# Patient Record
Sex: Female | Born: 1960 | Race: White | Hispanic: No | Marital: Married | Smoking: Never smoker
Health system: Southern US, Community
[De-identification: ages and names within clinical notes are randomized; demographics above are authoritative.]

## PROBLEM LIST (undated history)

## (undated) DIAGNOSIS — Z973 Presence of spectacles and contact lenses: Secondary | ICD-10-CM

## (undated) DIAGNOSIS — E785 Hyperlipidemia, unspecified: Secondary | ICD-10-CM

## (undated) DIAGNOSIS — K219 Gastro-esophageal reflux disease without esophagitis: Secondary | ICD-10-CM

## (undated) DIAGNOSIS — F988 Other specified behavioral and emotional disorders with onset usually occurring in childhood and adolescence: Secondary | ICD-10-CM

## (undated) HISTORY — PX: BREAST SURGERY: SHX581

## (undated) HISTORY — DX: Hyperlipidemia, unspecified: E78.5

## (undated) HISTORY — PX: BREAST EXCISIONAL BIOPSY: SUR124

---

## 2000-10-15 ENCOUNTER — Encounter: Payer: Self-pay | Admitting: *Deleted

## 2000-10-15 ENCOUNTER — Encounter: Admission: RE | Admit: 2000-10-15 | Discharge: 2000-10-15 | Payer: Self-pay | Admitting: *Deleted

## 2001-03-11 ENCOUNTER — Other Ambulatory Visit: Admission: RE | Admit: 2001-03-11 | Discharge: 2001-03-11 | Payer: Self-pay | Admitting: *Deleted

## 2002-09-20 ENCOUNTER — Encounter: Admission: RE | Admit: 2002-09-20 | Discharge: 2002-09-20 | Payer: Self-pay | Admitting: *Deleted

## 2002-09-20 ENCOUNTER — Encounter: Payer: Self-pay | Admitting: *Deleted

## 2002-10-06 ENCOUNTER — Other Ambulatory Visit: Admission: RE | Admit: 2002-10-06 | Discharge: 2002-10-06 | Payer: Self-pay | Admitting: *Deleted

## 2003-07-07 HISTORY — PX: TOTAL ABDOMINAL HYSTERECTOMY: SHX209

## 2003-10-25 ENCOUNTER — Other Ambulatory Visit: Admission: RE | Admit: 2003-10-25 | Discharge: 2003-10-25 | Payer: Self-pay | Admitting: *Deleted

## 2003-11-13 ENCOUNTER — Inpatient Hospital Stay (HOSPITAL_COMMUNITY): Admission: RE | Admit: 2003-11-13 | Discharge: 2003-11-17 | Payer: Self-pay | Admitting: *Deleted

## 2003-11-18 ENCOUNTER — Inpatient Hospital Stay (HOSPITAL_COMMUNITY): Admission: RE | Admit: 2003-11-18 | Discharge: 2003-11-20 | Payer: Self-pay | Admitting: Obstetrics and Gynecology

## 2003-11-18 ENCOUNTER — Encounter: Payer: Self-pay | Admitting: Obstetrics and Gynecology

## 2004-07-06 HISTORY — PX: ABDOMINAL HYSTERECTOMY: SHX81

## 2005-10-19 ENCOUNTER — Other Ambulatory Visit: Admission: RE | Admit: 2005-10-19 | Discharge: 2005-10-19 | Payer: Self-pay | Admitting: Obstetrics and Gynecology

## 2010-07-27 ENCOUNTER — Encounter: Payer: Self-pay | Admitting: *Deleted

## 2012-10-26 ENCOUNTER — Telehealth: Payer: Self-pay | Admitting: Physician Assistant

## 2012-10-26 MED ORDER — AMPHETAMINE-DEXTROAMPHETAMINE 30 MG PO TABS: 30.0000 mg | ORAL_TABLET | Freq: Every day | ORAL | Status: DC

## 2012-10-26 NOTE — Telephone Encounter (Signed)
Pt appropriate for refill.  Next office visit due June.  Rx printed for provider signature.  Pt called and told ready for pick up anytime tomorrow.

## 2012-12-01 ENCOUNTER — Telehealth: Payer: Self-pay | Admitting: Physician Assistant

## 2012-12-01 MED ORDER — AMPHETAMINE-DEXTROAMPHETAMINE 30 MG PO TABS: 30.0000 mg | ORAL_TABLET | Freq: Every day | ORAL | Status: DC

## 2012-12-01 NOTE — Telephone Encounter (Signed)
Refill appropriate.  Will need appt for next refill.  Rx printed for provider signature

## 2012-12-01 NOTE — Telephone Encounter (Signed)
Pt called left mess Rx ready for pick up.  Also informed that will need appt for next refill

## 2012-12-02 ENCOUNTER — Ambulatory Visit (INDEPENDENT_AMBULATORY_CARE_PROVIDER_SITE_OTHER): Payer: 59 | Admitting: *Deleted

## 2012-12-02 DIAGNOSIS — N390 Urinary tract infection, site not specified: Secondary | ICD-10-CM

## 2012-12-03 LAB — URINALYSIS W MICROSCOPIC + REFLEX CULTURE
Crystals: NONE SEEN
Nitrite: POSITIVE — AB

## 2012-12-05 LAB — URINE CULTURE: Colony Count: >=100000

## 2013-01-05 ENCOUNTER — Ambulatory Visit (INDEPENDENT_AMBULATORY_CARE_PROVIDER_SITE_OTHER): Payer: 59 | Admitting: Physician Assistant

## 2013-01-05 ENCOUNTER — Encounter: Payer: Self-pay | Admitting: Physician Assistant

## 2013-01-05 VITALS — BP 110/80 | HR 96 | Temp 98.9°F | Resp 16 | Ht 69.0 in | Wt 181.0 lb

## 2013-01-05 DIAGNOSIS — F988 Other specified behavioral and emotional disorders with onset usually occurring in childhood and adolescence: Secondary | ICD-10-CM

## 2013-01-05 MED ORDER — AMPHETAMINE-DEXTROAMPHETAMINE 30 MG PO TABS: 30.0000 mg | ORAL_TABLET | Freq: Two times a day (BID) | ORAL | Status: DC

## 2013-01-05 NOTE — Progress Notes (Signed)
   Patient ID: Kathryn Murray MRN: [id number], DOB: Sep 11, 1960, 52 y.o. Date of Encounter: 01/05/2013, 10:51 AM    Chief Complaint:  Chief Complaint  Patient presents with  . Medication Refill     HPI: 52 y.o. year old white female here for f/u of ADD. She is currently taking Adderall 30mg  Qam. This works perfectly during the day, but in the afternoon, when it wears off, she "crashes." She has absolutely no energy. She cannot get anything done the rest of the day. In past Adderall XR caused insomnia.  No other complaints. No palpitations, headache, abd pain.  She works from [employer]. She can tell a big difference when she takes hte medication and it really helps her focus, pay attention, and complete tasks. Works great except for the "crash" when it wears off.    Home Meds: See attached medication section for any medications that were entered at today's visit. The computer does not put those onto this list.The following list is a list of meds entered prior to today's visit.   No current outpatient prescriptions on file prior to visit.   No current facility-administered medications on file prior to visit.    Allergies:  Allergies  Allergen Reactions  . Sulphadimidine (Sulfamethazine)       Review of Systems: See HPI for pertinent ROS. All other ROS negative.    Physical Exam: Blood pressure 110/80, pulse 96, temperature 98.9 F (37.2 C), temperature source Oral, resp. rate 16, height 5\' 9"  (1.753 m), weight 181 lb (82.101 kg)., Body mass index is 26.72 kg/(m^2). General: WNWD WF. Appears in no acute distress. Lungs: Clear bilaterally to auscultation without wheezes, rales, or rhonchi. Breathing is unlabored. Heart: Regular rhythm. No murmurs, rubs, or gallops. Msk:  Strength and tone normal for age. Extremities/Skin: Warm and dry.  Neuro: Alert and oriented X 3. Moves all extremities spontaneously. Gait is normal. CNII-XII grossly in tact. Psych:  Responds to  questions appropriately with a normal affect.     ASSESSMENT AND PLAN:  52 y.o. year old female with  1. ADD (attention deficit disorder) Will try Adderall 30mg  BID. She wants to change from QD b/c of "crash." But, cannot use XR b/c it caused insomnia. This should work well. She takes am dose around 7:30am. She is to take 2nd dose around 12:30pm. Adjust timing depending on insomnia, etc. I am only giving her one month supply untils we see how this works for her. F/U OV 1 month. - amphetamine-dextroamphetamine (ADDERALL) 30 MG tablet; Take 1 tablet (30 mg total) by mouth 2 (two) times daily.  Dispense: 60 tablet; Refill: 0   Signed, [address] [place], [place], [facility] 01/05/2013 10:51 AM

## 2013-02-02 ENCOUNTER — Ambulatory Visit: Payer: 59 | Admitting: Physician Assistant

## 2013-02-28 ENCOUNTER — Telehealth: Payer: Self-pay | Admitting: Physician Assistant

## 2013-02-28 DIAGNOSIS — F988 Other specified behavioral and emotional disorders with onset usually occurring in childhood and adolescence: Secondary | ICD-10-CM

## 2013-02-28 MED ORDER — AMPHETAMINE-DEXTROAMPHETAMINE 30 MG PO TABS: 30.0000 mg | ORAL_TABLET | Freq: Two times a day (BID) | ORAL | Status: DC

## 2013-02-28 NOTE — Telephone Encounter (Signed)
Adderall 30 mg 1 BID #60 - she said that it works great

## 2013-02-28 NOTE — Telephone Encounter (Signed)
Last OV 01/05/13  New rx #60 that day.  Refill appropriate  Rx printed for provider signature

## 2013-04-19 ENCOUNTER — Telehealth: Payer: Self-pay | Admitting: Physician Assistant

## 2013-04-19 DIAGNOSIS — F988 Other specified behavioral and emotional disorders with onset usually occurring in childhood and adolescence: Secondary | ICD-10-CM

## 2013-04-19 MED ORDER — AMPHETAMINE-DEXTROAMPHETAMINE 30 MG PO TABS: 30.0000 mg | ORAL_TABLET | Freq: Two times a day (BID) | ORAL | Status: DC

## 2013-04-19 NOTE — Telephone Encounter (Signed)
Ok to refill 

## 2013-04-19 NOTE — Telephone Encounter (Signed)
Is she is still taking the Adderall 30 mg twice a day? If so, okay to refill. #60 with 0 refill.

## 2013-04-19 NOTE — Telephone Encounter (Signed)
Needs her adderall refilled

## 2013-04-19 NOTE — Telephone Encounter (Addendum)
rx printed for provider signature.  Pt aware ready for pick up 

## 2013-06-08 ENCOUNTER — Telehealth: Payer: Self-pay | Admitting: Physician Assistant

## 2013-06-08 DIAGNOSIS — F988 Other specified behavioral and emotional disorders with onset usually occurring in childhood and adolescence: Secondary | ICD-10-CM

## 2013-06-08 NOTE — Telephone Encounter (Signed)
Pt is calling because she is needing a refill on her adderral Call back number is [phone]

## 2013-06-08 NOTE — Telephone Encounter (Signed)
Last refill 10/15  #60.  Last OV 01/05/13. OK refill?

## 2013-06-09 MED ORDER — AMPHETAMINE-DEXTROAMPHETAMINE 30 MG PO TABS: 30.0000 mg | ORAL_TABLET | Freq: Two times a day (BID) | ORAL | Status: DC

## 2013-06-09 NOTE — Telephone Encounter (Signed)
Approved. Go Ahead and give her 3 printed prescriptions. One to fill now, one to fill 07/10/2013,   one to fill 08/10/2013

## 2013-06-09 NOTE — Telephone Encounter (Signed)
RX's printed for provider signature

## 2013-10-12 ENCOUNTER — Encounter: Payer: Self-pay | Admitting: Physician Assistant

## 2013-10-12 ENCOUNTER — Ambulatory Visit (INDEPENDENT_AMBULATORY_CARE_PROVIDER_SITE_OTHER): Payer: 59 | Admitting: Physician Assistant

## 2013-10-12 VITALS — BP 118/74 | HR 88 | Temp 98.7°F | Resp 18 | Ht 67.5 in | Wt 173.0 lb

## 2013-10-12 DIAGNOSIS — B9689 Other specified bacterial agents as the cause of diseases classified elsewhere: Secondary | ICD-10-CM

## 2013-10-12 DIAGNOSIS — J988 Other specified respiratory disorders: Secondary | ICD-10-CM

## 2013-10-12 DIAGNOSIS — A499 Bacterial infection, unspecified: Secondary | ICD-10-CM

## 2013-10-12 MED ORDER — AZITHROMYCIN 250 MG PO TABS: ORAL_TABLET | ORAL | Status: DC

## 2013-10-12 NOTE — Progress Notes (Signed)
    Patient ID: Kathryn Murray MRN: [id number], DOB: [birth year], 53 y.o. Date of Encounter: 10/12/2013, 2:49 PM    Chief Complaint:  Chief Complaint  Patient presents with  . fever/cough x 2 days    congestion     HPI: 53 y.o. year old female reports that she developed a fever 2 days ago. Reports that yesterday fever was 101. Says she felt really bad and felt very sick yesterday. Continued to have fever throughout last  night. Taken ibuprofen today so she now is afebrile. Says that for the past few days she has also had chest congestion and can feel phlegm in her chest but cannot get it out. Has had no head or nasal congestion. No nasal mucous, Sore throat or earache.     Home Meds: See attached medication section for any medications that were entered at today's visit. The computer does not put those onto this list.The following list is a list of meds entered prior to today's visit.   Current Outpatient Prescriptions on File Prior to Visit  Medication Sig Dispense Refill  . amphetamine-dextroamphetamine (ADDERALL) 30 MG tablet Take 1 tablet (30 mg total) by mouth 2 (two) times daily.  60 tablet  0   No current facility-administered medications on file prior to visit.    Allergies:  Allergies  Allergen Reactions  . Sulphadimidine [Sulfamethazine]       Review of Systems: See HPI for pertinent ROS. All other ROS negative.    Physical Exam: Blood pressure 118/74, pulse 88, temperature 98.7 F (37.1 C), temperature source Oral, resp. rate 18, height 5' 7.5" (1.715 m), weight 173 lb (78.472 kg)., Body mass index is 26.68 kg/(m^2). General: WNWD WF.  Appears in no acute distress. HEENT: Normocephalic, atraumatic, eyes without discharge, sclera non-icteric, nares are without discharge. Bilateral auditory canals clear, TM's are without perforation, pearly grey and translucent with reflective cone of light bilaterally. Oral cavity moist, posterior pharynx without exudate, erythema,  peritonsillar abscess. No Tenderness with percussion of frontal and maxillary sinuses.  Neck: Supple. No thyromegaly. No lymphadenopathy. Lungs: Clear bilaterally to auscultation without wheezes, rales, or rhonchi. Breathing is unlabored. Heart: Regular rhythm. No murmurs, rubs, or gallops. Msk:  Strength and tone normal for age. Extremities/Skin: Warm and dry. Neuro: Alert and oriented X 3. Moves all extremities spontaneously. Gait is normal. CNII-XII grossly in tact. Psych:  Responds to questions appropriately with a normal affect.     ASSESSMENT AND PLAN:  53 y.o. year old female with  1. Bacterial lower respiratory infection - azithromycin (ZITHROMAX) 250 MG tablet; Day 1: Take 2 daily.  Days 2-5: Take 1 daily.  Dispense: 6 tablet; Refill: 0 And also use Mucinex DM as expectorant. Followup if symptoms worsen significantly or if symptoms not resolved within one week after completion of antibiotic.  Marin Olp [place], [place], [facility] 10/12/2013 2:49 PM

## 2013-12-25 ENCOUNTER — Encounter: Payer: Self-pay | Admitting: Physician Assistant

## 2013-12-25 ENCOUNTER — Ambulatory Visit (INDEPENDENT_AMBULATORY_CARE_PROVIDER_SITE_OTHER): Payer: 59 | Admitting: Physician Assistant

## 2013-12-25 VITALS — BP 126/82 | HR 72 | Temp 98.5°F | Resp 18 | Wt 168.0 lb

## 2013-12-25 DIAGNOSIS — F988 Other specified behavioral and emotional disorders with onset usually occurring in childhood and adolescence: Secondary | ICD-10-CM | POA: Insufficient documentation

## 2013-12-25 MED ORDER — AMPHETAMINE-DEXTROAMPHETAMINE 30 MG PO TABS: 30.0000 mg | ORAL_TABLET | Freq: Two times a day (BID) | ORAL | Status: DC

## 2013-12-25 NOTE — Progress Notes (Signed)
Patient ID: LAQUANNA VEAZEY MRN: [id number], DOB: 10/24/1960, 53 y.o. Date of Encounter: 12/25/2013, 3:04 PM    Chief Complaint:  Chief Complaint  Patient presents with  . checkup for Adderall refill     HPI: 53 y.o. year old white female here for f/u of ADD.  Her last routine office visit for this was actually back in July 2014.  She says the reason that it has been a while since she has had followup for this is that she had a hand injury. Said that she simply did a flicking motion with her fingers-- but that caused problems with her tendons. She is seeing Dr. Burney Gauze at the [facility]. Says that he has been treating it with a splint for 6 weeks and then followup. Says that her followup appointment is tomorrow to decide what to do next. Says it has not healed. She has been out of work for 6 months. 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 Works on [employer]. Says that she is paid based on productivity. Says that she has to be able to work quickly when she returns to work. She wanted to go ahead and come here and get refills on her Adderall 7 if she does return to work after tomorrow's visit, she will have the medication available to use.  At her last office visit regarding this and 01/2013 she was taking Adderall 30mg  Qam. She said that worked perfectly during the day, but in the afternoon, when it would wear off, she would "crash." She had absolutely no energy. She could not get anything done the rest of the day. Inthe past, Adderall XR caused insomnia.  At that OV, I prescribed Adderall 30mg  BID. Says that that does seem to work perfectly. She was able to adjust the timing of when she took each dose. She was able to keep good coverage regarding having good attention and focus through the entire day, but then not have insomnia at night.     Home Meds:    Outpatient Prescriptions Prior to Visit  Medication Sig  Dispense Refill  . oxyCODONE-acetaminophen (PERCOCET/ROXICET) 5-325 MG per tablet Take 1 tablet by mouth every 6 (six) hours as needed for severe pain.      Marland Kitchen amphetamine-dextroamphetamine (ADDERALL) 30 MG tablet Take 1 tablet (30 mg total) by mouth 2 (two) times daily.  60 tablet  0  . azithromycin (ZITHROMAX) 250 MG tablet Day 1: Take 2 daily.  Days 2-5: Take 1 daily.  6 tablet  0   No facility-administered medications prior to visit.     Allergies:  Allergies  Allergen Reactions  . Sulphadimidine [Sulfamethazine]       Review of Systems: See HPI for pertinent ROS. All other ROS negative.    Physical Exam: Blood pressure 126/82, pulse 72, temperature 98.5 F (36.9 C), temperature source Oral, resp. rate 18, weight 168 lb (76.204 kg)., Body mass index is 25.91 kg/(m^2). General: WNWD WF. Appears in no acute distress. Lungs: Clear bilaterally to auscultation without wheezes, rales, or rhonchi. Breathing is unlabored. Heart: Regular rhythm. No murmurs, rubs, or gallops. Msk:  Strength and tone normal for age. Extremities/Skin: Warm and dry.  Neuro: Alert and oriented X 3. Moves all extremities spontaneously. Gait is normal. CNII-XII grossly in tact. Psych:  Responds to questions appropriately with a normal affect.     ASSESSMENT AND PLAN:  53 y.o. year old female with  1. ADD (  attention deficit disorder) I Printed 3 prescriptions. One could be filled now, 1 for 01/24/14, one for 02/24/14. She states that her pharmacy will keep future prescriptions on hold for her. Regular office visit in 6 months or sooner if needed. - amphetamine-dextroamphetamine (ADDERALL) 30 MG tablet; Take 1 tablet (30 mg total) by mouth 2 (two) times daily.  Dispense: 60 tablet; Refill: 0 - amphetamine-dextroamphetamine (ADDERALL) 30 MG tablet; Take 1 tablet (30 mg total) by mouth 2 (two) times daily.  Dispense: 60 tablet; Refill: 0 - amphetamine-dextroamphetamine (ADDERALL) 30 MG tablet; Take 1 tablet (30  mg total) by mouth 2 (two) times daily.  Dispense: 60 tablet; Refill: 0  Signed, [address], [place], [facility] 12/25/2013 3:04 PM

## 2013-12-28 ENCOUNTER — Encounter (HOSPITAL_BASED_OUTPATIENT_CLINIC_OR_DEPARTMENT_OTHER): Payer: Self-pay | Admitting: *Deleted

## 2013-12-28 ENCOUNTER — Other Ambulatory Visit: Payer: Self-pay | Admitting: Orthopedic Surgery

## 2013-12-28 NOTE — Progress Notes (Signed)
No labs needed

## 2014-01-03 ENCOUNTER — Encounter (HOSPITAL_BASED_OUTPATIENT_CLINIC_OR_DEPARTMENT_OTHER)
Admission: RE | Disposition: A | Discharge status: Home / Self Care | Payer: Self-pay | Source: Ambulatory Visit | Attending: Orthopedic Surgery

## 2014-01-03 ENCOUNTER — Encounter (HOSPITAL_BASED_OUTPATIENT_CLINIC_OR_DEPARTMENT_OTHER): Payer: Self-pay | Admitting: Certified Registered"

## 2014-01-03 ENCOUNTER — Ambulatory Visit (HOSPITAL_BASED_OUTPATIENT_CLINIC_OR_DEPARTMENT_OTHER)
Admission: RE | Admit: 2014-01-03 | Discharge: 2014-01-03 | Disposition: A | Discharge status: Home / Self Care | Payer: 59 | Source: Ambulatory Visit | Attending: Orthopedic Surgery | Admitting: Orthopedic Surgery

## 2014-01-03 ENCOUNTER — Encounter (HOSPITAL_BASED_OUTPATIENT_CLINIC_OR_DEPARTMENT_OTHER): Payer: 59 | Admitting: Certified Registered"

## 2014-01-03 ENCOUNTER — Ambulatory Visit (HOSPITAL_BASED_OUTPATIENT_CLINIC_OR_DEPARTMENT_OTHER): Payer: 59 | Admitting: Certified Registered"

## 2014-01-03 DIAGNOSIS — M66239 Spontaneous rupture of extensor tendons, unspecified forearm: Secondary | ICD-10-CM | POA: Insufficient documentation

## 2014-01-03 DIAGNOSIS — M66241 Spontaneous rupture of extensor tendons, right hand: Secondary | ICD-10-CM

## 2014-01-03 DIAGNOSIS — K219 Gastro-esophageal reflux disease without esophagitis: Secondary | ICD-10-CM | POA: Insufficient documentation

## 2014-01-03 DIAGNOSIS — M66231 Spontaneous rupture of extensor tendons, right forearm: Secondary | ICD-10-CM

## 2014-01-03 DIAGNOSIS — M66249 Spontaneous rupture of extensor tendons, unspecified hand: Principal | ICD-10-CM | POA: Insufficient documentation

## 2014-01-03 DIAGNOSIS — F988 Other specified behavioral and emotional disorders with onset usually occurring in childhood and adolescence: Secondary | ICD-10-CM | POA: Insufficient documentation

## 2014-01-03 HISTORY — PX: LIGAMENT REPAIR: SHX5444

## 2014-01-03 HISTORY — DX: Gastro-esophageal reflux disease without esophagitis: K21.9

## 2014-01-03 HISTORY — DX: Other specified behavioral and emotional disorders with onset usually occurring in childhood and adolescence: F98.8

## 2014-01-03 HISTORY — DX: Presence of spectacles and contact lenses: Z97.3

## 2014-01-03 LAB — POCT HEMOGLOBIN-HEMACUE: Hemoglobin: 14.1 g/dL (ref 12.0–15.0)

## 2014-01-03 SURGERY — REPAIR, LIGAMENT
Anesthesia: General | Laterality: Right

## 2014-01-03 MED ORDER — ONDANSETRON HCL 4 MG/2ML IJ SOLN
INTRAMUSCULAR | Status: DC | PRN
  Administered 2014-01-03: 4 mg via INTRAVENOUS

## 2014-01-03 MED ORDER — BUPIVACAINE HCL (PF) 0.25 % IJ SOLN
INTRAMUSCULAR | Status: AC
  Filled 2014-01-03: qty 30

## 2014-01-03 MED ORDER — LIDOCAINE HCL (CARDIAC) 20 MG/ML IV SOLN
INTRAVENOUS | Status: DC | PRN
  Administered 2014-01-03: 60 mg via INTRAVENOUS

## 2014-01-03 MED ORDER — HYDROMORPHONE HCL PF 1 MG/ML IJ SOLN
0.2500 mg | INTRAMUSCULAR | Status: DC | PRN
Start: 2014-01-03 — End: 2014-01-03
  Administered 2014-01-03 (×4): 0.5 mg via INTRAVENOUS

## 2014-01-03 MED ORDER — FENTANYL CITRATE 0.05 MG/ML IJ SOLN: 50.0000 ug | INTRAMUSCULAR | Status: DC | PRN

## 2014-01-03 MED ORDER — LACTATED RINGERS IV SOLN
INTRAVENOUS | Status: DC
  Administered 2014-01-03 (×2): via INTRAVENOUS

## 2014-01-03 MED ORDER — OXYCODONE HCL 5 MG/5ML PO SOLN: 5.0000 mg | Freq: Once | ORAL | Status: AC | PRN

## 2014-01-03 MED ORDER — OXYCODONE-ACETAMINOPHEN 5-325 MG PO TABS: 1.0000 | ORAL_TABLET | ORAL | Status: DC | PRN

## 2014-01-03 MED ORDER — PROPOFOL 10 MG/ML IV EMUL
INTRAVENOUS | Status: AC
Start: 2014-01-03 — End: 2014-01-03
  Filled 2014-01-03: qty 50

## 2014-01-03 MED ORDER — DEXAMETHASONE SODIUM PHOSPHATE 10 MG/ML IJ SOLN
INTRAMUSCULAR | Status: DC | PRN
  Administered 2014-01-03: 10 mg via INTRAVENOUS

## 2014-01-03 MED ORDER — HYDROMORPHONE HCL PF 1 MG/ML IJ SOLN
INTRAMUSCULAR | Status: AC
  Filled 2014-01-03: qty 1

## 2014-01-03 MED ORDER — PROMETHAZINE HCL 25 MG/ML IJ SOLN: 6.2500 mg | INTRAMUSCULAR | Status: DC | PRN

## 2014-01-03 MED ORDER — FENTANYL CITRATE 0.05 MG/ML IJ SOLN
INTRAMUSCULAR | Status: DC | PRN
  Administered 2014-01-03 (×2): 25 ug via INTRAVENOUS
  Administered 2014-01-03: 50 ug via INTRAVENOUS

## 2014-01-03 MED ORDER — MIDAZOLAM HCL 5 MG/5ML IJ SOLN
INTRAMUSCULAR | Status: DC | PRN
  Administered 2014-01-03: 2 mg via INTRAVENOUS

## 2014-01-03 MED ORDER — PROPOFOL 10 MG/ML IV BOLUS
INTRAVENOUS | Status: DC | PRN
  Administered 2014-01-03: 200 mg via INTRAVENOUS

## 2014-01-03 MED ORDER — CEFAZOLIN SODIUM-DEXTROSE 2-3 GM-% IV SOLR
2.0000 g | INTRAVENOUS | Status: AC
  Administered 2014-01-03: 2 g via INTRAVENOUS

## 2014-01-03 MED ORDER — OXYCODONE HCL 5 MG PO TABS
5.0000 mg | ORAL_TABLET | Freq: Once | ORAL | Status: AC | PRN
  Administered 2014-01-03: 5 mg via ORAL

## 2014-01-03 MED ORDER — BUPIVACAINE HCL (PF) 0.25 % IJ SOLN
INTRAMUSCULAR | Status: DC | PRN
  Administered 2014-01-03: 6 mL

## 2014-01-03 MED ORDER — MIDAZOLAM HCL 2 MG/2ML IJ SOLN
INTRAMUSCULAR | Status: AC
  Filled 2014-01-03: qty 2

## 2014-01-03 MED ORDER — MIDAZOLAM HCL 2 MG/2ML IJ SOLN: 1.0000 mg | INTRAMUSCULAR | Status: DC | PRN

## 2014-01-03 MED ORDER — CHLORHEXIDINE GLUCONATE 4 % EX LIQD: 60.0000 mL | Freq: Once | CUTANEOUS | Status: DC

## 2014-01-03 MED ORDER — CEFAZOLIN SODIUM-DEXTROSE 2-3 GM-% IV SOLR
INTRAVENOUS | Status: AC
  Filled 2014-01-03: qty 50

## 2014-01-03 MED ORDER — OXYCODONE HCL 5 MG PO TABS
ORAL_TABLET | ORAL | Status: AC
  Filled 2014-01-03: qty 1

## 2014-01-03 MED ORDER — FENTANYL CITRATE 0.05 MG/ML IJ SOLN
INTRAMUSCULAR | Status: AC
  Filled 2014-01-03: qty 6

## 2014-01-03 SURGICAL SUPPLY — 68 items
APL SKNCLS STERI-STRIP NONHPOA (GAUZE/BANDAGES/DRESSINGS)
BAG DECANTER FOR FLEXI CONT (MISCELLANEOUS) IMPLANT
BANDAGE ELASTIC 3 VELCRO ST LF (GAUZE/BANDAGES/DRESSINGS) IMPLANT
BANDAGE ELASTIC 4 VELCRO ST LF (GAUZE/BANDAGES/DRESSINGS) ×3 IMPLANT
BENZOIN TINCTURE PRP APPL 2/3 (GAUZE/BANDAGES/DRESSINGS) IMPLANT
BLADE MINI RND TIP GREEN BEAV (BLADE) IMPLANT
BLADE SURG 15 STRL LF DISP TIS (BLADE) ×1 IMPLANT
BLADE SURG 15 STRL SS (BLADE) ×3
BNDG CMPR 9X4 STRL LF SNTH (GAUZE/BANDAGES/DRESSINGS) ×1
BNDG CMPR MD 5X2 ELC HKLP STRL (GAUZE/BANDAGES/DRESSINGS)
BNDG COHESIVE 3X5 TAN STRL LF (GAUZE/BANDAGES/DRESSINGS) ×1 IMPLANT
BNDG ELASTIC 2 VLCR STRL LF (GAUZE/BANDAGES/DRESSINGS) IMPLANT
BNDG ESMARK 4X9 LF (GAUZE/BANDAGES/DRESSINGS) ×2 IMPLANT
BNDG GAUZE ELAST 4 BULKY (GAUZE/BANDAGES/DRESSINGS) ×3 IMPLANT
CANISTER SUCT 1200ML W/VALVE (MISCELLANEOUS) IMPLANT
CLOSURE WOUND 1/2 X4 (GAUZE/BANDAGES/DRESSINGS)
CORDS BIPOLAR (ELECTRODE) ×3 IMPLANT
COVER TABLE BACK 60X90 (DRAPES) ×3 IMPLANT
CUFF TOURNIQUET SINGLE 18IN (TOURNIQUET CUFF) ×2 IMPLANT
DECANTER SPIKE VIAL GLASS SM (MISCELLANEOUS) IMPLANT
DRAPE EXTREMITY T 121X128X90 (DRAPE) ×3 IMPLANT
DRAPE SURG 17X23 STRL (DRAPES) ×3 IMPLANT
DURAPREP 26ML APPLICATOR (WOUND CARE) ×3 IMPLANT
GAUZE SPONGE 4X4 12PLY STRL (GAUZE/BANDAGES/DRESSINGS) ×3 IMPLANT
GAUZE SPONGE 4X4 16PLY XRAY LF (GAUZE/BANDAGES/DRESSINGS) ×2 IMPLANT
GAUZE XEROFORM 1X8 LF (GAUZE/BANDAGES/DRESSINGS) IMPLANT
GLOVE SURG SYN 8.0 (GLOVE) ×3 IMPLANT
GLOVE SURG SYN 8.0 PF PI (GLOVE) ×2 IMPLANT
GOWN STRL REUS W/ TWL LRG LVL3 (GOWN DISPOSABLE) ×1 IMPLANT
GOWN STRL REUS W/TWL LRG LVL3 (GOWN DISPOSABLE) ×3
GOWN STRL REUS W/TWL XL LVL3 (GOWN DISPOSABLE) ×3 IMPLANT
NDL HYPO 25X1 1.5 SAFETY (NEEDLE) IMPLANT
NEEDLE HYPO 25X1 1.5 SAFETY (NEEDLE) ×3 IMPLANT
NS IRRIG 1000ML POUR BTL (IV SOLUTION) ×3 IMPLANT
PACK BASIN DAY SURGERY FS (CUSTOM PROCEDURE TRAY) ×3 IMPLANT
PAD CAST 3X4 CTTN HI CHSV (CAST SUPPLIES) ×1 IMPLANT
PAD CAST 4YDX4 CTTN HI CHSV (CAST SUPPLIES) IMPLANT
PADDING CAST ABS 3INX4YD NS (CAST SUPPLIES)
PADDING CAST ABS 4INX4YD NS (CAST SUPPLIES) ×2
PADDING CAST ABS COTTON 3X4 (CAST SUPPLIES) IMPLANT
PADDING CAST ABS COTTON 4X4 ST (CAST SUPPLIES) ×1 IMPLANT
PADDING CAST COTTON 3X4 STRL (CAST SUPPLIES) ×3
PADDING CAST COTTON 4X4 STRL (CAST SUPPLIES) ×3
PENCIL BUTTON HOLSTER BLD 10FT (ELECTRODE) IMPLANT
SHEET MEDIUM DRAPE 40X70 STRL (DRAPES) ×3 IMPLANT
SPLINT PLASTER CAST XFAST 4X15 (CAST SUPPLIES) ×5 IMPLANT
SPLINT PLASTER XTRA FAST SET 4 (CAST SUPPLIES) ×30
STOCKINETTE 4X48 STRL (DRAPES) ×3 IMPLANT
STRIP CLOSURE SKIN 1/2X4 (GAUZE/BANDAGES/DRESSINGS) IMPLANT
SUCTION FRAZIER TIP 10 FR DISP (SUCTIONS) IMPLANT
SUT ETHILON 4 0 PS 2 18 (SUTURE) IMPLANT
SUT ETHILON 5 0 PS 2 18 (SUTURE) IMPLANT
SUT FIBERWIRE 4-0 18 TAPR NDL (SUTURE) ×3
SUT MERSILENE 4 0 P 3 (SUTURE) IMPLANT
SUT POLY BUTTON 15MM (SUTURE) IMPLANT
SUT PROLENE 2 0 SH DA (SUTURE) IMPLANT
SUT PROLENE 3 0 PS 2 (SUTURE) IMPLANT
SUT VIC AB 4-0 P-3 18XBRD (SUTURE) IMPLANT
SUT VIC AB 4-0 P3 18 (SUTURE)
SUT VICRYL RAPIDE 4-0 (SUTURE) IMPLANT
SUT VICRYL RAPIDE 4/0 PS 2 (SUTURE) IMPLANT
SUTURE FIBERWR 4-0 18 TAPR NDL (SUTURE) IMPLANT
SYR BULB 3OZ (MISCELLANEOUS) ×3 IMPLANT
SYRINGE 12CC LL (MISCELLANEOUS) ×2 IMPLANT
TOWEL OR 17X24 6PK STRL BLUE (TOWEL DISPOSABLE) ×3 IMPLANT
TUBE CONNECTING 20'X1/4 (TUBING)
TUBE CONNECTING 20X1/4 (TUBING) IMPLANT
UNDERPAD 30X30 INCONTINENT (UNDERPADS AND DIAPERS) ×3 IMPLANT

## 2014-01-03 NOTE — H&P (Signed)
Kathryn Murray is an 53 y.o. female.   Chief Complaint: right long MCPJ pain and swelling HPI: as above s/p minor injury to right long MCPJ with continued pain and instablitity despite conservative care  Past Medical History  Diagnosis Date  . Wears glasses     reading  . ADD (attention deficit disorder)   . GERD (gastroesophageal reflux disease)     Past Surgical History  Procedure Laterality Date  . Abdominal hysterectomy  2006  . Breast surgery      right br bx x2    History reviewed. No pertinent family history. Social History:  reports that she has never smoked. She has never used smokeless tobacco. She reports that she drinks alcohol. She reports that she does not use illicit drugs.  Allergies:  Allergies  Allergen Reactions  . Sulphadimidine [Sulfamethazine] Rash    Medications Prior to Admission  Medication Sig Dispense Refill  . amphetamine-dextroamphetamine (ADDERALL) 30 MG tablet Take 1 tablet (30 mg total) by mouth 2 (two) times daily.  60 tablet  0  . oxyCODONE-acetaminophen (PERCOCET/ROXICET) 5-325 MG per tablet Take 1 tablet by mouth every 6 (six) hours as needed for severe pain.      . ranitidine (ZANTAC) 150 MG capsule Take 150 mg by mouth 2 (two) times daily.        Results for orders placed during the hospital encounter of 01/03/14 (from the past 48 hour(s))  POCT HEMOGLOBIN-HEMACUE     Status: None   Collection Time    01/03/14  8:43 AM      Result Value Ref Range   Hemoglobin 14.1  12.0 - 15.0 g/dL   No results found.  Review of Systems  All other systems reviewed and are negative.   Blood pressure 123/64, pulse 66, temperature 97.4 F (36.3 C), temperature source Oral, resp. rate 20, height 5\' 9"  (1.753 m), weight 76.204 kg (168 lb), SpO2 100.00%. Physical Exam  Constitutional: She is oriented to person, place, and time. She appears well-developed and well-nourished.  HENT:  Head: Normocephalic and atraumatic.  Cardiovascular: Normal rate.    Respiratory: Effort normal.  Musculoskeletal:       Right hand: She exhibits tenderness, deformity and swelling.  Right long finger radial sided MCPJ swelilng and instability  Neurological: She is alert and oriented to person, place, and time.  Skin: Skin is warm.  Psychiatric: She has a normal mood and affect. Her behavior is normal. Judgment and thought content normal.     Assessment/Plan As above   Plan explore and repair as needed  Raelie Lohr A 01/03/2014, 9:17 AM

## 2014-01-03 NOTE — Op Note (Signed)
See note [phone]

## 2014-01-03 NOTE — Discharge Instructions (Signed)

## 2014-01-03 NOTE — Transfer of Care (Signed)
Immediate Anesthesia Transfer of Care Note  Patient: Kathryn Murray  Procedure(s) Performed: Procedure(s): RIGHT LONG REPAIR VS RECONSTRUCTION OF RADIAL COLLATERAL LIGAMENT  (Right)  Patient Location: PACU  Anesthesia Type:General  Level of Consciousness: awake, alert , oriented and patient cooperative  Airway & Oxygen Therapy: Patient Spontanous Breathing and Patient connected to face mask oxygen  Post-op Assessment: Report given to PACU RN and Post -op Vital signs reviewed and stable  Post vital signs: Reviewed and stable  Complications: No apparent anesthesia complications

## 2014-01-03 NOTE — Op Note (Signed)
NAMEALLA, SLOMA NO.:  [id number]  MEDICAL RECORD NO.:  [id number]  LOCATION:                                 FACILITY:  PHYSICIAN:  Sheral Apley. Christin Moline, M.D.DATE OF BIRTH:  [birth year]  DATE OF PROCEDURE:  01/03/2014 DATE OF DISCHARGE:  01/03/2014                              OPERATIVE REPORT   PREOPERATIVE DIAGNOSIS:  Chronic rupture, right long finger, radial sagittal band.  POSTOPERATIVE DIAGNOSIS:  Chronic rupture, right long finger, radial sagittal band.  PROCEDURE:  Reconstruction above.  SURGEON:  Sheral Apley. Burney Gauze, MD  ASSISTANT:  None.  ANESTHESIA:  General.  COMPLICATIONS:  No complications.  DRAINS:  No drains.  Patient was taken to operating suite.  After induction of adequate general anesthesia, right upper extremity was prepped and draped in sterile fashion.  An Esmarch was used to exsanguinate the limb. Tourniquet was inflated to 250 mmHg.  At this point in time, dissection was carried down to the radial side of the metacarpophalangeal joint, right long finger.  We carefully exposed the extensor mechanism, noticed obvious subluxation extensor mechanism to the ulnar inner metacarpal valley with complete rupture of the radial sagittal band and contracture of the ulnar sagittal band with limited tenolysis, extensor tendon proximal and distal to the metacarpophalangeal joint.  We then carefully elevated a flap of the contracted ulnar sagittal band area.  We centralized the tendon, then using FiberWire suture 4-0 x5 sutures.  We sutured the flap to the radial side and then metacarpal valley using simple and horizontal mattress sutures to stabilize the radial side of metacarpophalangeal joint.  At the end of this procedure, the extensor mechanism was centered and stable over the metacarpophalangeal joint. We then irrigated and loosely closed with a 3-0 Prolene subcuticular stitch.  Steri-Strips, 4x4s, fluffs, and a volar splint was  applied with the wrist slightly extended.  The metacarpophalangeal joint was slightly extended and the PIP joints free.  Patient tolerated procedure well in concealed fashion.    Sheral Apley Burney Gauze, M.D.    MAW/MEDQ  D:  01/03/2014  T:  01/03/2014  Job:  [id number]

## 2014-01-03 NOTE — Anesthesia Preprocedure Evaluation (Addendum)
Anesthesia Evaluation  Patient identified by MRN, date of birth, ID band Patient awake    Reviewed: Allergy & Precautions, H&P , NPO status , Patient's Chart, lab work & pertinent test results  History of Anesthesia Complications Negative for: history of anesthetic complications  Airway Mallampati: I TM Distance: >3 FB Neck ROM: full    Dental no notable dental hx. (+) Teeth Intact, Dental Advisory Given   Pulmonary neg pulmonary ROS,  breath sounds clear to auscultation  Pulmonary exam normal       Cardiovascular negative cardio ROS  IRhythm:regular Rate:Normal     Neuro/Psych negative neurological ROS  negative psych ROS   GI/Hepatic negative GI ROS, Neg liver ROS, GERD-  Medicated and Controlled,  Endo/Other  negative endocrine ROS  Renal/GU negative Renal ROS  negative genitourinary   Musculoskeletal   Abdominal   Peds  Hematology negative hematology ROS (+)   Anesthesia Other Findings   Reproductive/Obstetrics negative OB ROS                          Anesthesia Physical Anesthesia Plan  ASA: II  Anesthesia Plan: General and General LMA   Post-op Pain Management:    Induction: Intravenous  Airway Management Planned: LMA  Additional Equipment:   Intra-op Plan:   Post-operative Plan: Extubation in OR  Informed Consent: I have reviewed the patients History and Physical, chart, labs and discussed the procedure including the risks, benefits and alternatives for the proposed anesthesia with the patient or authorized representative who has indicated his/her understanding and acceptance.   Dental advisory given  Plan Discussed with: CRNA, Surgeon and Anesthesiologist  Anesthesia Plan Comments:        Anesthesia Quick Evaluation

## 2014-01-03 NOTE — Anesthesia Procedure Notes (Signed)
Procedure Name: LMA Insertion Date/Time: 01/03/2014 9:34 AM Performed by: Amirr Achord Pre-anesthesia Checklist: Patient identified, Emergency Drugs available, Suction available and Patient being monitored Patient Re-evaluated:Patient Re-evaluated prior to inductionOxygen Delivery Method: Circle System Utilized Preoxygenation: Pre-oxygenation with 100% oxygen Intubation Type: IV induction Ventilation: Mask ventilation without difficulty LMA: LMA inserted LMA Size: 4.0 Number of attempts: 1 Airway Equipment and Method: bite block Placement Confirmation: positive ETCO2 Tube secured with: Tape Dental Injury: Teeth and Oropharynx as per pre-operative assessment

## 2014-01-03 NOTE — Anesthesia Postprocedure Evaluation (Signed)
  Anesthesia Post-op Note  Patient: Kathryn Murray  Procedure(s) Performed: Procedure(s): RIGHT LONG REPAIR VS RECONSTRUCTION OF RADIAL COLLATERAL LIGAMENT  (Right)  Patient Location: PACU  Anesthesia Type:General  Level of Consciousness: awake, alert  and oriented  Airway and Oxygen Therapy: Patient Spontanous Breathing  Post-op Pain: mild  Post-op Assessment: Post-op Vital signs reviewed  Post-op Vital Signs: Reviewed  Last Vitals:  Filed Vitals:   01/03/14 1045  BP: 132/72  Pulse: 85  Temp:   Resp: 13    Complications: No apparent anesthesia complications

## 2014-01-04 ENCOUNTER — Encounter (HOSPITAL_BASED_OUTPATIENT_CLINIC_OR_DEPARTMENT_OTHER): Payer: Self-pay | Admitting: Orthopedic Surgery

## 2014-05-29 ENCOUNTER — Encounter: Payer: Self-pay | Admitting: Physician Assistant

## 2014-05-29 ENCOUNTER — Ambulatory Visit (INDEPENDENT_AMBULATORY_CARE_PROVIDER_SITE_OTHER): Payer: 59 | Admitting: Physician Assistant

## 2014-05-29 VITALS — BP 132/84 | HR 80 | Temp 99.0°F | Resp 18 | Wt 173.0 lb

## 2014-05-29 DIAGNOSIS — F988 Other specified behavioral and emotional disorders with onset usually occurring in childhood and adolescence: Secondary | ICD-10-CM

## 2014-05-29 DIAGNOSIS — M545 Low back pain, unspecified: Secondary | ICD-10-CM

## 2014-05-29 DIAGNOSIS — F909 Attention-deficit hyperactivity disorder, unspecified type: Secondary | ICD-10-CM

## 2014-05-29 DIAGNOSIS — Z23 Encounter for immunization: Secondary | ICD-10-CM

## 2014-05-29 MED ORDER — CYCLOBENZAPRINE HCL 10 MG PO TABS: 10.0000 mg | ORAL_TABLET | Freq: Three times a day (TID) | ORAL | Status: DC | PRN

## 2014-05-29 MED ORDER — AMPHETAMINE-DEXTROAMPHETAMINE 30 MG PO TABS: 30.0000 mg | ORAL_TABLET | Freq: Two times a day (BID) | ORAL | Status: DC

## 2014-05-29 MED ORDER — OXYCODONE-ACETAMINOPHEN 5-325 MG PO TABS: 1.0000 | ORAL_TABLET | Freq: Three times a day (TID) | ORAL | Status: DC | PRN

## 2014-05-29 MED ORDER — MELOXICAM 7.5 MG PO TABS: 7.5000 mg | ORAL_TABLET | Freq: Every day | ORAL | Status: DC

## 2014-05-29 MED ORDER — HYDROCODONE-ACETAMINOPHEN 5-325 MG PO TABS: 1.0000 | ORAL_TABLET | Freq: Four times a day (QID) | ORAL | Status: DC | PRN

## 2014-05-29 NOTE — Progress Notes (Signed)
Patient ID: Kathryn Murray MRN: [id number], DOB: 1960/09/27, 53 y.o. Date of Encounter: 05/29/2014, 4:02 PM    Chief Complaint:  Chief Complaint  Patient presents with  . c/o back pain    hurting on/off for several weeks, felt something pull in back     HPI: 53 y.o. year old white female here for the above.   She says that in the past was probably around 2007 that she did have evaluation for some back pain. Saw Raliegh Ip. Remembers that they did an x-ray which was negative. Was felt to be muscular back pain and the symptoms resolved. Says she has had no significant problems with her back until this episode which is just been occurring over the last several weeks.  Says that she has an acquaintance who had back pain for several weeks and ended up being diagnosed that it was cancer and actually has died from that.  Says that is one reason that she wanted to come in today and get this evaluated because it is concerning her that her back is still bothering her after several weeks.  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 However she has done no significant lifting or any other specific activity that she can think of that would've prompted this back pain. She says that this discomfort started across her low back about 3 weeks ago. Says that she was already having some mild discomfort and then about 2 weeks ago she just bent forward to pick up something and had significant pain with that particular movement. He points to her bilateral low back at about L5 level as the area of discomfort. Says that it just stays across her low back and is not going into her by talking area and nothing down her legs. Says that this past Saturday the pain was really bad and on Sunday it was bad as well and she was "in tears". Says last  night she used a heating pad and it seems like it is better today. Slight if she moved a certain way that it would really hurt even today. Had no incontinence of bowel or bladder. Has had no pain numbness or tingling down either leg. No cauda equina symptoms.  Later in the visit, she says that she also needs f/u regarding ADD. Needs to pick up prescriptions while she is here. 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 Works on [employer]. Says that she is paid based on productivity. Says that she has to be able to work quickly. At her  office visit regarding this and 01/2013 she was taking Adderall 30mg  Qam. She said that worked perfectly during the day, but in the afternoon, when it would wear off, she would "crash." She had absolutely no energy. She could not get anything done the rest of the day. Inthe past, Adderall XR caused insomnia.  At prior OV, I prescribed Adderall 30mg  BID. Since then, she has reported that this works perfectly. She is able to adjust the timing of when she takes each dose. She is able to keep good coverage regarding having good attention and focus through the entire day, but then not have insomnia at night. Says it continues to work well for her and continues to cause no side effects.  Home Meds:    Outpatient Prescriptions Prior to Visit  Medication Sig Dispense Refill  . ranitidine (ZANTAC) 150 MG capsule Take 150 mg by mouth 2 (two) times daily.    Marland Kitchen amphetamine-dextroamphetamine (ADDERALL) 30 MG tablet Take 1 tablet (30 mg total) by mouth 2 (two) times daily. 60 tablet 0  . oxyCODONE-acetaminophen (PERCOCET/ROXICET) 5-325 MG per tablet Take 1 tablet by mouth every 6 (six) hours as needed for severe pain.    Marland Kitchen oxyCODONE-acetaminophen (ROXICET) 5-325 MG per tablet Take 1 tablet by mouth every 4 (four) hours as needed for severe pain. (Patient not taking: Reported on 05/29/2014) 30 tablet 0   No  facility-administered medications prior to visit.     Allergies:  Allergies  Allergen Reactions  . Sulphadimidine [Sulfamethazine] Rash      Review of Systems: See HPI for pertinent ROS. All other ROS negative.    Physical Exam: Blood pressure 132/84, pulse 80, temperature 99 F (37.2 C), temperature source Oral, resp. rate 18, weight 173 lb (78.472 kg)., Body mass index is 25.54 kg/(m^2). General: WNWD WF. Appears in no acute distress. Lungs: Clear bilaterally to auscultation without wheezes, rales, or rhonchi. Breathing is unlabored. Heart: Regular rhythm. No murmurs, rubs, or gallops. Msk:  Strength and tone normal for age. She points to bilateral low back at about L5 level as the area of her pain. There is some mild pain reproduced with palpation. No pain with palpation of the sciatic notches bilaterally. Right straight leg raise does cause pain in the left low back. Right hip abduction causes minimal discomfort. Left straight leg raise and left hip abduction causes minimal discomfort. Extremities/Skin: Warm and dry.  Neuro: Alert and oriented X 3. Moves all extremities spontaneously. Gait is normal. CNII-XII grossly in tact. Psych:  Responds to questions appropriately with a normal affect.     ASSESSMENT AND PLAN:  53 y.o. year old female with  1. ADD (attention deficit disorder) I Printed 3 prescriptions. One could be filled now, 1 for 06/28/14, one for 07/29/2014.  Regular office visit in 6 months or sooner if needed. - amphetamine-dextroamphetamine (ADDERALL) 30 MG tablet; Take 1 tablet (30 mg total) by mouth 2 (two) times daily.  Dispense: 60 tablet; Refill: 0 - amphetamine-dextroamphetamine (ADDERALL) 30 MG tablet; Take 1 tablet (30 mg total) by mouth 2 (two) times daily.  Dispense: 60 tablet; Refill: 0 - amphetamine-dextroamphetamine (ADDERALL) 30 MG tablet; Take 1 tablet (30 mg total) by mouth 2 (two) times daily.  Dispense: 60 tablet; Refill: 0   2. Bilateral low  back pain without sciatica - DG Lumbar Spine Complete; Future - cyclobenzaprine (FLEXERIL) 10 MG tablet; Take 1 tablet (10 mg total) by mouth 3 (three) times daily as needed for muscle spasms.  Dispense: 30 tablet; Refill: 0 - HYDROcodone-acetaminophen (NORCO/VICODIN) 5-325 MG per tablet; Take 1 tablet by mouth every 6 (six) hours as needed.  Dispense: 30 tablet; Refill: 0---AFTER I PORINTED THIS, SHE SAYS SHE IS INTOLERANT TO THIS BAUT CAN TAKE PERCOCET--I SHREDDED THIS RX.  - meloxicam (MOBIC) 7.5 MG tablet; Take 1 tablet (7.5 mg total) by mouth daily.  Dispense: 30 tablet; Refill: 0 - oxyCODONE-acetaminophen (ROXICET) 5-325 MG per tablet; Take 1 tablet by mouth every 8 (eight) hours as needed for severe pain.  Dispense: 20 tablet; Refill: 0  Obtain x-ray. She will use muscle relaxer She can take Motrin daily with food. Continue to apply heat. She will use the Percocet only as needed for if she has recurrent severe  pain episodes. Follow-up if symptoms are not resolving in one week.   3. Need for prophylactic vaccination and inoculation against influenza - Flu Vaccine QUAD 36+ mos IM  Signed, [address] [place], [place], [facility] 05/29/2014 4:02 PM

## 2014-06-07 ENCOUNTER — Ambulatory Visit
Admission: RE | Admit: 2014-06-07 | Discharge: 2014-06-07 | Disposition: A | Discharge status: Home / Self Care | Payer: 59 | Source: Ambulatory Visit | Attending: Physician Assistant | Admitting: Physician Assistant

## 2014-06-07 DIAGNOSIS — M545 Low back pain, unspecified: Secondary | ICD-10-CM

## 2014-06-18 ENCOUNTER — Telehealth: Payer: Self-pay | Admitting: Family Medicine

## 2014-06-18 DIAGNOSIS — M545 Low back pain, unspecified: Secondary | ICD-10-CM

## 2014-06-18 MED ORDER — CYCLOBENZAPRINE HCL 10 MG PO TABS: 10.0000 mg | ORAL_TABLET | Freq: Three times a day (TID) | ORAL | Status: DC | PRN

## 2014-06-18 MED ORDER — OXYCODONE-ACETAMINOPHEN 5-325 MG PO TABS: 1.0000 | ORAL_TABLET | Freq: Four times a day (QID) | ORAL | Status: DC | PRN

## 2014-06-18 NOTE — Telephone Encounter (Signed)
She is intolerant to Hydrocodone but can take Oxy.  Print RX for Oxycodone 5 + 325mg   1 po Q 6 hours prn severe pain.  # 30 + 0.

## 2014-06-18 NOTE — Telephone Encounter (Signed)
RX printed for provider signature and pt aware can pick up in the AM

## 2014-06-18 NOTE — Telephone Encounter (Signed)
-----   Message from Orlena Sheldon, PA-C sent at 06/18/2014 12:47 PM EST ----- Yes may refill Flexeril 10 mg 1 by mouth 3 times a day when necessary #60+0 refill

## 2014-06-18 NOTE — Telephone Encounter (Signed)
Flexeril refilled . Pt aware.  Also wanted refill of Hydrocodone??

## 2014-10-26 ENCOUNTER — Telehealth: Payer: Self-pay | Admitting: Physician Assistant

## 2014-10-26 DIAGNOSIS — F988 Other specified behavioral and emotional disorders with onset usually occurring in childhood and adolescence: Secondary | ICD-10-CM

## 2014-10-26 NOTE — Telephone Encounter (Signed)
Patient is asking for adderall rx  [phone]

## 2014-10-29 MED ORDER — AMPHETAMINE-DEXTROAMPHETAMINE 30 MG PO TABS: 30.0000 mg | ORAL_TABLET | Freq: Two times a day (BID) | ORAL | Status: DC

## 2014-10-29 NOTE — Telephone Encounter (Signed)
Last Rx 05/29/14 #60.  LOV 05/29/14  OK refill?

## 2014-10-29 NOTE — Telephone Encounter (Signed)
Called pt, she would like two refills.  Then will need to be seen.   Rx's printed for provider.

## 2014-10-29 NOTE — Telephone Encounter (Signed)
Find out if she wants [place] to print 2 prescriptions or just one.  05/29/14 printed 3 Rxes--for 05/29/14, 06/28/14, 07/29/14.  Has had none since.  Not sure if she wants to worry about future Rx or not.

## 2015-01-23 ENCOUNTER — Encounter: Payer: Self-pay | Admitting: Physician Assistant

## 2015-01-23 ENCOUNTER — Ambulatory Visit (INDEPENDENT_AMBULATORY_CARE_PROVIDER_SITE_OTHER): Payer: Commercial Managed Care - HMO | Admitting: Physician Assistant

## 2015-01-23 VITALS — BP 120/78 | HR 80 | Temp 98.0°F | Resp 18 | Wt 159.0 lb

## 2015-01-23 DIAGNOSIS — F988 Other specified behavioral and emotional disorders with onset usually occurring in childhood and adolescence: Secondary | ICD-10-CM

## 2015-01-23 DIAGNOSIS — F909 Attention-deficit hyperactivity disorder, unspecified type: Secondary | ICD-10-CM

## 2015-01-23 MED ORDER — AMPHETAMINE-DEXTROAMPHETAMINE 30 MG PO TABS: 30.0000 mg | ORAL_TABLET | Freq: Two times a day (BID) | ORAL | Status: DC

## 2015-01-23 NOTE — Progress Notes (Signed)
Patient ID: Kathryn Murray MRN: [id number], DOB: Mar 14, 1961, 54 y.o. Date of Encounter: 01/23/2015, 9:26 AM    Chief Complaint:  Chief Complaint  Patient presents with  . Medication Refill    adderall     HPI: 54 y.o. year old white female here for the above.   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 Works on [employer]. Says that she is paid based on productivity. Says that she has to be able to work quickly. She is also doing some photography work as well. At her  office visit regarding this and 01/2013 she was taking Adderall 30mg  Qam. She said that worked perfectly during the day, but in the afternoon, when it would wear off, she would "crash." She had absolutely no energy. She could not get anything done the rest of the day. Inthe past, Adderall XR caused insomnia.  At prior OV, I prescribed Adderall 30mg  BID. Since then, she has reported that this works perfectly. She is able to adjust the timing of when she takes each dose. She is able to keep good coverage regarding having good attention and focus through the entire day, but then not have insomnia at night. Says it continues to work well for her and continues to cause no side effects.     Home Meds:    Outpatient Prescriptions Prior to Visit  Medication Sig Dispense Refill  . amphetamine-dextroamphetamine (ADDERALL) 30 MG tablet Take 1 tablet by mouth 2 (two) times daily. 60 tablet 0  . ranitidine (ZANTAC) 150 MG capsule Take 150 mg by mouth 2 (two) times daily.    . cyclobenzaprine (FLEXERIL) 10 MG tablet Take 1 tablet (10 mg total) by mouth 3 (three) times daily as needed for muscle spasms. (Patient not taking: Reported on 01/23/2015) 60 tablet 0  . meloxicam (MOBIC) 7.5 MG tablet Take 1 tablet (7.5 mg total) by mouth daily. (Patient not taking: Reported on 01/23/2015) 30 tablet 0  . oxyCODONE-acetaminophen (ROXICET) 5-325 MG per tablet Take 1 tablet by  mouth every 6 (six) hours as needed for severe pain. (Patient not taking: Reported on 01/23/2015) 30 tablet 0   No facility-administered medications prior to visit.     Allergies:  Allergies  Allergen Reactions  . Sulphadimidine [Sulfamethazine] Rash      Review of Systems: See HPI for pertinent ROS. All other ROS negative.    Physical Exam: Blood pressure 120/78, pulse 80, temperature 98 F (36.7 C), temperature source Oral, resp. rate 18, weight 159 lb (72.122 kg)., Body mass index is 23.47 kg/(m^2). General: WNWD WF. Appears in no acute distress. Lungs: Clear bilaterally to auscultation without wheezes, rales, or rhonchi. Breathing is unlabored. Heart: Regular rhythm. No murmurs, rubs, or gallops. Msk:  Strength and tone normal for age. Extremities/Skin: Warm and dry.  Neuro: Alert and oriented X 3. Moves all extremities spontaneously. Gait is normal. CNII-XII grossly in tact. Psych:  Responds to questions appropriately with a normal affect.     ASSESSMENT AND PLAN:  54 y.o. year old female with  1. ADD (attention deficit disorder) I Printed 3 prescriptions. One could be filled now, 1 for 02/23/2015, one for 03/08/2015 Regular office visit in 6 months or sooner if needed. - amphetamine-dextroamphetamine (ADDERALL) 30 MG tablet; Take 1 tablet (30 mg total) by mouth 2 (two) times daily.  Dispense: 60 tablet; Refill: 0 - amphetamine-dextroamphetamine (ADDERALL) 30 MG tablet; Take 1 tablet (30 mg total) by mouth  2 (two) times daily.  Dispense: 60 tablet; Refill: 0 - amphetamine-dextroamphetamine (ADDERALL) 30 MG tablet; Take 1 tablet (30 mg total) by mouth 2 (two) times daily.  Dispense: 60 tablet; Refill: 0    Signed, [address] [place], [place], [facility] 01/23/2015 9:26 AM

## 2015-05-16 ENCOUNTER — Telehealth: Payer: Self-pay | Admitting: Physician Assistant

## 2015-05-16 DIAGNOSIS — F988 Other specified behavioral and emotional disorders with onset usually occurring in childhood and adolescence: Secondary | ICD-10-CM

## 2015-05-16 MED ORDER — AMPHETAMINE-DEXTROAMPHETAMINE 30 MG PO TABS: 30.0000 mg | ORAL_TABLET | Freq: Two times a day (BID) | ORAL | Status: DC

## 2015-05-16 NOTE — Telephone Encounter (Signed)
Pt aware ready for pick up 

## 2015-05-16 NOTE — Telephone Encounter (Signed)
Approved to print 3 Rxes.

## 2015-05-16 NOTE — Telephone Encounter (Signed)
Patient is calling for rx for her adderall [phone]

## 2015-05-16 NOTE — Telephone Encounter (Signed)
LOV 01/23/15.  LRF 01/23/15  OK refill?

## 2015-05-28 ENCOUNTER — Encounter: Payer: Self-pay | Admitting: Family Medicine

## 2015-05-28 ENCOUNTER — Ambulatory Visit (INDEPENDENT_AMBULATORY_CARE_PROVIDER_SITE_OTHER): Payer: Commercial Managed Care - HMO | Admitting: Family Medicine

## 2015-05-28 VITALS — BP 118/64 | HR 76 | Temp 98.6°F | Resp 12 | Ht 67.5 in | Wt 172.0 lb

## 2015-05-28 DIAGNOSIS — S39012A Strain of muscle, fascia and tendon of lower back, initial encounter: Secondary | ICD-10-CM

## 2015-05-28 DIAGNOSIS — M549 Dorsalgia, unspecified: Secondary | ICD-10-CM | POA: Diagnosis not present

## 2015-05-28 DIAGNOSIS — G8929 Other chronic pain: Secondary | ICD-10-CM

## 2015-05-28 DIAGNOSIS — M47816 Spondylosis without myelopathy or radiculopathy, lumbar region: Secondary | ICD-10-CM

## 2015-05-28 MED ORDER — CYCLOBENZAPRINE HCL 10 MG PO TABS: 10.0000 mg | ORAL_TABLET | Freq: Three times a day (TID) | ORAL | Status: DC | PRN

## 2015-05-28 MED ORDER — IBUPROFEN 600 MG PO TABS: 600.0000 mg | ORAL_TABLET | Freq: Three times a day (TID) | ORAL | Status: DC | PRN

## 2015-05-28 MED ORDER — OXYCODONE-ACETAMINOPHEN 5-325 MG PO TABS: 1.0000 | ORAL_TABLET | Freq: Four times a day (QID) | ORAL | Status: DC | PRN

## 2015-05-28 NOTE — Progress Notes (Signed)
Patient ID: Kathryn Murray, female   DOB: 1961-04-02, 54 y.o.   MRN: [id number]      Subjective:    Patient ID: Kathryn Murray, female    DOB: 30-Sep-1960, 54 y.o.   MRN: [id number]  Patient presents for Lumbar Strain  patient here with acute on chronic back pain. She bends over to pick up something off of her chair and she felt a muscle strain. She took ibuprofen with minimal improvement last night. She has chronic back pain she has known facet arthropathy and mild general changes. 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 She takes ibuprofen as needed the past few months she has had daily pain with her back she has to guard herself when she tries to bend over or pick up anything. She denies any tingling or numbness in the lower extremities denies any radiating pain to her legs. The pain sits mostly in the lower lumbar region.    Review Of Systems:  GEN- denies fatigue, fever, weight loss,weakness, recent illness ABD- denies N/V, change in stools, abd pain GU- denies dysuria, hematuria, dribbling, incontinence MSK-+ joint pain, muscle aches, injury Neuro- denies headache, dizziness, syncope, seizure activity       Objective:    BP 118/64 mmHg  Pulse 76  Temp(Src) 98.6 F (37 C) (Oral)  Resp 12  Ht 5' 7.5" (1.715 m)  Wt 172 lb (78.019 kg)  BMI 26.53 kg/m2 GEN- NAD, alert and oriented x3 ABD-NABS,soft,NT,ND MSK-TTP lumbar [place], mild TTP over spine, +spasm and tenderness Left paraspinals with mild swelling, +SLR Left side, decreased ROM of spine, good ROM hips/knees Neuro- normal tone LE, strength decreased left compared to right ?pain, sensation in tact  EXT- No edema Pulses- Radial- 2+        Assessment & Plan:      Problem List Items Addressed This Visit    None    Visit Diagnoses    Lumbar strain, initial encounter    -  Primary    She has a lumbar strain on top of her chronic  back pain and facet arthritis today. I've given her Flexeril, Percocet and ibuprofen 600 mg I think that she needs MRI of her lumbar spine for further evaluation of her back pain. She has done supportive and conservative care for the past year or so with persistently worsening symptoms     Facet arthritis of lumbar region        Relevant Medications    ibuprofen (ADVIL,MOTRIN) 600 MG tablet    oxyCODONE-acetaminophen (ROXICET) 5-325 MG tablet    cyclobenzaprine (FLEXERIL) 10 MG tablet    Other Relevant Orders    MR Lumbar Spine Wo Contrast    Chronic back pain        Relevant Medications    ibuprofen (ADVIL,MOTRIN) 600 MG tablet    oxyCODONE-acetaminophen (ROXICET) 5-325 MG tablet    cyclobenzaprine (FLEXERIL) 10 MG tablet    Other Relevant Orders    MR Lumbar Spine Wo Contrast       Note: This dictation was prepared with Dragon dictation along with smaller phrase technology. Any transcriptional errors that result from this process are unintentional.

## 2015-05-28 NOTE — Patient Instructions (Signed)
Take pain meds as needed  Flexeril for muscle spasm Ibuprofen  MRI to be scheduled F/U as needed

## 2015-06-14 ENCOUNTER — Ambulatory Visit
Admission: RE | Admit: 2015-06-14 | Discharge: 2015-06-14 | Disposition: A | Discharge status: Home / Self Care | Payer: Commercial Managed Care - HMO | Source: Ambulatory Visit | Attending: Family Medicine | Admitting: Family Medicine

## 2015-06-14 DIAGNOSIS — M549 Dorsalgia, unspecified: Secondary | ICD-10-CM

## 2015-06-14 DIAGNOSIS — G8929 Other chronic pain: Secondary | ICD-10-CM

## 2015-06-14 DIAGNOSIS — M47816 Spondylosis without myelopathy or radiculopathy, lumbar region: Secondary | ICD-10-CM

## 2015-06-19 ENCOUNTER — Other Ambulatory Visit: Payer: Self-pay | Admitting: *Deleted

## 2015-06-19 DIAGNOSIS — M5126 Other intervertebral disc displacement, lumbar region: Secondary | ICD-10-CM

## 2015-06-19 DIAGNOSIS — M5136 Other intervertebral disc degeneration, lumbar region: Secondary | ICD-10-CM

## 2015-06-19 DIAGNOSIS — M47816 Spondylosis without myelopathy or radiculopathy, lumbar region: Secondary | ICD-10-CM

## 2015-07-31 ENCOUNTER — Ambulatory Visit: Payer: Commercial Managed Care - HMO | Admitting: Physician Assistant

## 2015-09-26 ENCOUNTER — Encounter: Payer: Self-pay | Admitting: Internal Medicine

## 2015-10-08 ENCOUNTER — Encounter: Payer: Self-pay | Admitting: Family Medicine

## 2015-10-08 ENCOUNTER — Ambulatory Visit (INDEPENDENT_AMBULATORY_CARE_PROVIDER_SITE_OTHER): Payer: Commercial Managed Care - HMO | Admitting: Family Medicine

## 2015-10-08 VITALS — BP 130/76 | HR 86 | Temp 98.5°F | Resp 14 | Ht 68.5 in | Wt 174.0 lb

## 2015-10-08 DIAGNOSIS — R300 Dysuria: Secondary | ICD-10-CM

## 2015-10-08 LAB — URINALYSIS, MICROSCOPIC ONLY
CASTS: NONE SEEN [LPF]
Crystals: NONE SEEN [HPF]
RBC / HPF: NONE SEEN RBC/HPF (ref <=2)

## 2015-10-08 LAB — URINALYSIS, ROUTINE W REFLEX MICROSCOPIC
Bilirubin Urine: NEGATIVE
GLUCOSE, UA: NEGATIVE
HGB URINE DIPSTICK: NEGATIVE
Ketones, ur: NEGATIVE
NITRITE: POSITIVE — AB
PH: 5.5 (ref 5.0–8.0)
SPECIFIC GRAVITY, URINE: 1.015 (ref 1.001–1.035)

## 2015-10-08 MED ORDER — CEPHALEXIN 500 MG PO CAPS: 500.0000 mg | ORAL_CAPSULE | Freq: Three times a day (TID) | ORAL | Status: DC

## 2015-10-08 NOTE — Progress Notes (Signed)
   Subjective:    Patient ID: Kathryn Murray, female    DOB: 1961-04-20, 55 y.o.   MRN: [id number]  HPI Symptoms began Sunday with dysuria, urgency, frequency, and hesitancy. She is on AZO and therefore urinalysis is going to be erroneous.  She denies any fevers or chills. She is allergic to sulfa drugs Past Medical History  Diagnosis Date  . Wears glasses     reading  . ADD (attention deficit disorder)   . GERD (gastroesophageal reflux disease)    Past Surgical History  Procedure Laterality Date  . Abdominal hysterectomy  2006  . Breast surgery      right br bx x2  . Ligament repair Right 01/03/2014    Procedure: RIGHT LONG REPAIR VS RECONSTRUCTION OF RADIAL COLLATERAL LIGAMENT ;  Surgeon: Schuyler Amor, MD;  Location: [place];  Service: Orthopedics;  Laterality: Right;   Current Outpatient Prescriptions on File Prior to Visit  Medication Sig Dispense Refill  . amphetamine-dextroamphetamine (ADDERALL) 30 MG tablet Take 1 tablet by mouth 2 (two) times daily. 60 tablet 0  . cyclobenzaprine (FLEXERIL) 10 MG tablet Take 1 tablet (10 mg total) by mouth 3 (three) times daily as needed for muscle spasms. 30 tablet 1  . Estradiol (DIVIGEL) 1 MG/GM GEL Place onto the skin.    Marland Kitchen ibuprofen (ADVIL,MOTRIN) 600 MG tablet Take 1 tablet (600 mg total) by mouth every 8 (eight) hours as needed. 45 tablet 1  . oxyCODONE-acetaminophen (ROXICET) 5-325 MG tablet Take 1 tablet by mouth every 6 (six) hours as needed for severe pain. 30 tablet 0   No current facility-administered medications on file prior to visit.   Allergies  Allergen Reactions  . Sulphadimidine [Sulfamethazine] Rash   Social History   Social History  . Marital Status: Married    Spouse Name: N/A  . Number of Children: N/A  . Years of Education: N/A   Occupational History  . Not on file.   Social History Main Topics  . Smoking status: Never Smoker   . Smokeless tobacco: Never Used  . Alcohol Use: Yes    Comment: Wine twice a week  . Drug Use: No  . Sexual Activity: Not on file   Other Topics Concern  . Not on file   Social History Narrative      Review of Systems  All other systems reviewed and are negative.      Objective:   Physical Exam  Cardiovascular: Normal rate, regular rhythm and normal heart sounds.   Pulmonary/Chest: Effort normal and breath sounds normal. No respiratory distress. She has no wheezes. She has no rales.  Abdominal: Soft. Bowel sounds are normal. She exhibits no distension. There is no tenderness. There is no rebound.  Vitals reviewed. no cvat        Assessment & Plan:  Burning with urination - Plan: Urinalysis, Routine w reflex microscopic (not at [facility]), cephALEXin (KEFLEX) 500 MG capsule  Symptoms are consistent with a urinary tract infection. Begin Keflex 500 mg by mouth 3 times a day for 5 days. I did give the patient 7 days in case symptoms persist longer than 5 days should be sufficient. Urinalysis results will likely be skewed by the presence of AZO

## 2015-10-10 ENCOUNTER — Ambulatory Visit: Payer: Commercial Managed Care - HMO | Admitting: Family Medicine

## 2015-10-25 ENCOUNTER — Other Ambulatory Visit: Payer: Self-pay | Admitting: Physician Assistant

## 2015-10-25 DIAGNOSIS — F988 Other specified behavioral and emotional disorders with onset usually occurring in childhood and adolescence: Secondary | ICD-10-CM

## 2015-10-25 NOTE — Telephone Encounter (Signed)
Approved. Can print 3 Rxes for future dates.

## 2015-10-25 NOTE — Telephone Encounter (Signed)
Patient needs rx for adderall  [phone]

## 2015-10-25 NOTE — Telephone Encounter (Signed)
Had been over 6 mths.  She made appt for Monday.

## 2015-10-25 NOTE — Telephone Encounter (Signed)
Last ADD visit 01/2015  LRF date fill 07/17/15  OK refill?

## 2015-10-28 ENCOUNTER — Encounter: Payer: Self-pay | Admitting: Physician Assistant

## 2015-10-28 ENCOUNTER — Ambulatory Visit (INDEPENDENT_AMBULATORY_CARE_PROVIDER_SITE_OTHER): Payer: Commercial Managed Care - HMO | Admitting: Physician Assistant

## 2015-10-28 VITALS — BP 122/82 | HR 76 | Temp 98.5°F | Resp 18 | Wt 175.0 lb

## 2015-10-28 DIAGNOSIS — F909 Attention-deficit hyperactivity disorder, unspecified type: Secondary | ICD-10-CM | POA: Diagnosis not present

## 2015-10-28 DIAGNOSIS — J988 Other specified respiratory disorders: Secondary | ICD-10-CM

## 2015-10-28 DIAGNOSIS — F988 Other specified behavioral and emotional disorders with onset usually occurring in childhood and adolescence: Secondary | ICD-10-CM

## 2015-10-28 DIAGNOSIS — B9689 Other specified bacterial agents as the cause of diseases classified elsewhere: Secondary | ICD-10-CM

## 2015-10-28 MED ORDER — AMPHETAMINE-DEXTROAMPHETAMINE 30 MG PO TABS: 30.0000 mg | ORAL_TABLET | Freq: Two times a day (BID) | ORAL | Status: DC

## 2015-10-28 MED ORDER — AZITHROMYCIN 250 MG PO TABS: ORAL_TABLET | ORAL | Status: DC

## 2015-10-28 NOTE — Progress Notes (Signed)
Patient ID: BECKEY HEIFETZ MRN: [id number], DOB: 1961-05-19, 55 y.o. Date of Encounter: 10/28/2015, 4:48 PM    Chief Complaint:  Chief Complaint  Patient presents with  . ADD refills/sick    congestion, runny nose, cough, HA     HPI: 55 y.o. year old white female here for the above.   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 Worked on the computer. Michela Pitcher that she is paid based on productivity. Says that she has to be able to work quickly. She was also doing some photography work as well.  At her visit 10/2015---- Says that that company was having to cut back and they offered her an early retirement package so she took that. 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 Says that her current dose of Adderall continues to work well and is causing no adverse effects.  Since we adjusted to Adderall 30mg  BID, she has reported that this works perfectly. She is able to adjust the timing of when she takes each dose. She is able to keep good coverage regarding having good attention and focus through the entire day, but then not have insomnia at night. Says it continues to work well for her and continues to cause no side effects.    At Ov 10/28/2015 she reports: Says that she has had runny nose for about 3 weeks. Says Friday the symptoms suddenly worsened significantly. Says the left side of her nose and face felt stopped up and congested. Since then it has seemed to alternate from side to side. Yesterday she stayed in bed all day and she was in bed all day today until she got up to come for this visit. She is feeling terrible. Feels that the congestions in her chest as well as her head.   Home Meds:    Outpatient Prescriptions Prior to Visit  Medication Sig Dispense Refill  . cyclobenzaprine (FLEXERIL) 10 MG tablet Take 1 tablet (10 mg total) by mouth 3 (three) times daily as needed for muscle spasms. 30 tablet 1  .  estradiol (VIVELLE-DOT) 0.05 MG/24HR patch APPLY 1 PATCH TWICE WEEKLY AS DIRECTED.  12  . ibuprofen (ADVIL,MOTRIN) 600 MG tablet Take 1 tablet (600 mg total) by mouth every 8 (eight) hours as needed. 45 tablet 1  . zolpidem (AMBIEN) 5 MG tablet Take 5 mg by mouth at bedtime as needed.  5  . amphetamine-dextroamphetamine (ADDERALL) 30 MG tablet Take 1 tablet by mouth 2 (two) times daily. 60 tablet 0  . cephALEXin (KEFLEX) 500 MG capsule Take 1 capsule (500 mg total) by mouth 3 (three) times daily. 21 capsule 0   No facility-administered medications prior to visit.     Allergies:  Allergies  Allergen Reactions  . Sulphadimidine [Sulfamethazine] Rash      Review of Systems: See HPI for pertinent ROS. All other ROS negative.    Physical Exam: Blood pressure 122/82, pulse 76, temperature 98.5 F (36.9 C), temperature source Oral, resp. rate 18, weight 175 lb (79.379 kg)., Body mass index is 26.22 kg/(m^2). General: WNWD WF. Appears in no acute distress. HEENT: Ears: Normal bilaterally. Oral mucosa normal. Pharynx normal with no erythema no exudate, no peritonsillar abscess. Sinuses: No tenderness with percussion of frontal or maxillary sinuses bilaterally. Lungs: Clear bilaterally to auscultation without wheezes, rales, or rhonchi. Breathing is unlabored. Heart: Regular rhythm. No murmurs, rubs, or gallops. Msk:  Strength and tone normal for age. Extremities/Skin: Warm  and dry.  Neuro: Alert and oriented X 3. Moves all extremities spontaneously. Gait is normal. CNII-XII grossly in tact. Psych:  Responds to questions appropriately with a normal affect.     ASSESSMENT AND PLAN:  55 y.o. year old female with  1. ADD (attention deficit disorder) I Printed 3 prescriptions. One could be filled now, 1 for 11/27/15, one for 12/28/2015 Regular office visit in 6 months or sooner if needed. - amphetamine-dextroamphetamine (ADDERALL) 30 MG tablet; Take 1 tablet (30 mg total) by mouth 2 (two)  times daily.  Dispense: 60 tablet; Refill: 0 - amphetamine-dextroamphetamine (ADDERALL) 30 MG tablet; Take 1 tablet (30 mg total) by mouth 2 (two) times daily.  Dispense: 60 tablet; Refill: 0 - amphetamine-dextroamphetamine (ADDERALL) 30 MG tablet; Take 1 tablet (30 mg total) by mouth 2 (two) times daily.  Dispense: 60 tablet; Refill: 0  2. Bacterial respiratory infection She is to take antibiotic as directed. Recommend Mucinex DM as expectorant. Follow-up if symptoms do not resolve within 1 week after completion of antibiotic. - azithromycin (ZITHROMAX) 250 MG tablet; Day 1: Take 2 daily. Days 2-5: Take 1 daily.  Dispense: 6 tablet; Refill: 0  Reviewed that she has not had a complete physical exam here. She agrees that she does need to schedule a physical. Says that she does see a gynecologist but they did not do labs at her recent visit there. She will schedule physical and return for this.  Marin Olp [place], [place], [facility] 10/28/2015 4:48 PM

## 2015-10-28 NOTE — Telephone Encounter (Signed)
ok 

## 2015-11-11 ENCOUNTER — Other Ambulatory Visit: Payer: Commercial Managed Care - HMO

## 2015-11-14 ENCOUNTER — Encounter: Payer: Commercial Managed Care - HMO | Admitting: Physician Assistant

## 2015-11-16 ENCOUNTER — Encounter: Payer: Self-pay | Admitting: Physician Assistant

## 2015-11-18 ENCOUNTER — Ambulatory Visit: Payer: Commercial Managed Care - HMO | Admitting: Internal Medicine

## 2015-11-18 ENCOUNTER — Telehealth: Payer: Self-pay | Admitting: Internal Medicine

## 2015-11-18 NOTE — Telephone Encounter (Signed)
No charge. 

## 2016-01-01 ENCOUNTER — Ambulatory Visit (INDEPENDENT_AMBULATORY_CARE_PROVIDER_SITE_OTHER): Payer: Commercial Managed Care - HMO | Admitting: Physician Assistant

## 2016-01-01 ENCOUNTER — Encounter: Payer: Self-pay | Admitting: Physician Assistant

## 2016-01-01 VITALS — BP 112/70 | HR 64 | Temp 98.7°F | Resp 16 | Ht 68.5 in | Wt 168.0 lb

## 2016-01-01 DIAGNOSIS — E538 Deficiency of other specified B group vitamins: Secondary | ICD-10-CM

## 2016-01-01 DIAGNOSIS — R202 Paresthesia of skin: Secondary | ICD-10-CM

## 2016-01-01 DIAGNOSIS — J029 Acute pharyngitis, unspecified: Secondary | ICD-10-CM | POA: Diagnosis not present

## 2016-01-01 DIAGNOSIS — R5383 Other fatigue: Secondary | ICD-10-CM

## 2016-01-01 DIAGNOSIS — E559 Vitamin D deficiency, unspecified: Secondary | ICD-10-CM | POA: Diagnosis not present

## 2016-01-01 LAB — STREP GROUP A AG, W/REFLEX TO CULT: STREGTOCOCCUS GROUP A AG SCREEN: NOT DETECTED

## 2016-01-01 NOTE — Progress Notes (Signed)
Patient ID: Kathryn Murray MRN: [id number], DOB: Oct 31, 1960, 55 y.o. Date of Encounter: @DATE @  Chief Complaint:  Chief Complaint  Patient presents with  . OTHER    Sore throat began Sunday, has noticed white spots on her tonsils, nasal congestion, Pt has also experienced fatigue and tingling in legs - she stes last time this happened her vit D and b12 were low    HPI: 55 y.o. year old female  presents with above.   Sunday 53/25/17--"just didn't feel good" Monday 6/26--Felt terrible, stayed in bed, had sore throat. Feels some congestion in nose but not blowing out of nose. Occasional "coughing spell"--thinsk sec to postnasal drip--no deep chest congestion.  Monday temp 100.7. No fever since.    Also: No energy / fatigue "for a while---though hormone patch from Gyn would help but it ahsnt' Tingling in legs--past ~ 5 days---"it did this in past whne Vit D and B12 were low"   Past Medical History  Diagnosis Date  . Wears glasses     reading  . ADD (attention deficit disorder)   . GERD (gastroesophageal reflux disease)      Home Meds: Outpatient Prescriptions Prior to Visit  Medication Sig Dispense Refill  . amphetamine-dextroamphetamine (ADDERALL) 30 MG tablet Take 1 tablet by mouth 2 (two) times daily. 60 tablet 0  . estradiol (VIVELLE-DOT) 0.05 MG/24HR patch APPLY 1 PATCH TWICE WEEKLY AS DIRECTED.  12  . ibuprofen (ADVIL,MOTRIN) 600 MG tablet Take 1 tablet (600 mg total) by mouth every 8 (eight) hours as needed. 45 tablet 1  . zolpidem (AMBIEN) 5 MG tablet Take 5 mg by mouth at bedtime as needed.  5  . cyclobenzaprine (FLEXERIL) 10 MG tablet Take 1 tablet (10 mg total) by mouth 3 (three) times daily as needed for muscle spasms. (Patient not taking: Reported on 01/01/2016) 30 tablet 1  . azithromycin (ZITHROMAX) 250 MG tablet Day 1: Take 2 daily. Days 2-5: Take 1 daily. (Patient not taking: Reported on 01/01/2016) 6 tablet 0   No facility-administered medications prior to  visit.    Allergies:  Allergies  Allergen Reactions  . Sulphadimidine [Sulfamethazine] Rash    Social History   Social History  . Marital Status: Married    Spouse Name: N/A  . Number of Children: N/A  . Years of Education: N/A   Occupational History  . Not on file.   Social History Main Topics  . Smoking status: Never Smoker   . Smokeless tobacco: Never Used  . Alcohol Use: Yes     Comment: Wine twice a week  . Drug Use: No  . Sexual Activity: Not on file   Other Topics Concern  . Not on file   Social History Narrative    No family history on file.   Review of Systems:  See HPI for pertinent ROS. All other ROS negative.    Physical Exam: Blood pressure 112/70, pulse 64, temperature 98.7 F (37.1 C), temperature source Oral, resp. rate 16, height 5' 8.5" (1.74 m), weight 168 lb (76.204 kg)., Body mass index is 25.17 kg/(m^2). General: WNWD WF. Appears in no acute distress. Head: Normocephalic, atraumatic, eyes without discharge, sclera non-icteric, nares are without discharge. Bilateral auditory canals clear, TM's are without perforation, pearly grey and translucent with reflective cone of light bilaterally. Oral cavity moist, posterior pharynx with mild erythema. There is a tiny white papule on posterior pharynx on right side. No exudate. No peritonsillar abscess. Neck: Supple. No thyromegaly. No lymphadenopathy. Lungs:  Clear bilaterally to auscultation without wheezes, rales, or rhonchi. Breathing is unlabored. Heart: RRR with S1 S2. No murmurs, rubs, or gallops. Musculoskeletal:  Strength and tone normal for age. Extremities/Skin: Warm and dry. No rashes. Neuro: Alert and oriented X 3. Moves all extremities spontaneously. Gait is normal. CNII-XII grossly in tact. Psych:  Responds to questions appropriately with a normal affect.   Results for orders placed or performed in visit on 01/01/16  STREP GROUP A AG, W/REFLEX TO CULT  Result Value Ref Range   SOURCE  THROAT    STREGTOCOCCUS GROUP A AG SCREEN Not Detected      ASSESSMENT AND PLAN:  55 y.o. year old female with  1. Sorethroat - STREP GROUP A AG, W/REFLEX TO CULT Discussed that most likely Viral. If this is the case, this should run its course and resolve on its own ~ 7days. If symptoms not improved by Friday noon, call and will add abx. In interim, ow--use lozenges, spray, for pain relief.   2. Other fatigue Check labs to evaluate - CBC with Differential/Platelet - COMPLETE METABOLIC PANEL WITH GFR - TSH - Vitamin B12  3. Paresthesia of bilateral legs Check labs to evaluate. - CBC with Differential/Platelet - COMPLETE METABOLIC PANEL WITH GFR - TSH - Vitamin B12  4. Vitamin D deficiency - VITAMIN D 25 Hydroxy (Vit-D Deficiency, Fractures)  5. Vitamin B 12 deficiency - Vitamin B12   Signed, [address] [place], [place], [facility] 01/01/2016 1:36 PM

## 2016-01-02 LAB — CBC WITH DIFFERENTIAL/PLATELET
Basophils Absolute: 79 cells/uL (ref 0–200)
Basophils Relative: 1 %
EOS ABS: 316 {cells}/uL (ref 15–500)
Eosinophils Relative: 4 %
HEMATOCRIT: 40.1 % (ref 35.0–45.0)
Hemoglobin: 13.5 g/dL (ref 12.0–15.0)
LYMPHS PCT: 26 %
Lymphs Abs: 2054 cells/uL (ref 850–3900)
MCH: 28 pg (ref 27.0–33.0)
MCHC: 33.7 g/dL (ref 32.0–36.0)
MCV: 83.2 fL (ref 80.0–100.0)
MONO ABS: 553 {cells}/uL (ref 200–950)
MONOS PCT: 7 %
MPV: 10.6 fL (ref 7.5–12.5)
NEUTROS PCT: 62 %
Neutro Abs: 4898 cells/uL (ref 1500–7800)
Platelets: 219 10*3/uL (ref 140–400)
RBC: 4.82 MIL/uL (ref 3.80–5.10)
RDW: 13.9 % (ref 11.0–15.0)
WBC: 7.9 10*3/uL (ref 3.8–10.8)

## 2016-01-02 LAB — COMPLETE METABOLIC PANEL WITH GFR
ALBUMIN: 4 g/dL (ref 3.6–5.1)
ALK PHOS: 73 U/L (ref 33–130)
ALT: 16 U/L (ref 6–29)
AST: 17 U/L (ref 10–35)
BILIRUBIN TOTAL: 0.5 mg/dL (ref 0.2–1.2)
BUN: 14 mg/dL (ref 7–25)
CALCIUM: 8.9 mg/dL (ref 8.6–10.4)
CO2: 23 mmol/L (ref 20–31)
Chloride: 103 mmol/L (ref 98–110)
Creat: 0.9 mg/dL (ref 0.50–1.05)
GFR, EST AFRICAN AMERICAN: 85 mL/min (ref >=60)
GFR, EST NON AFRICAN AMERICAN: 74 mL/min (ref >=60)
GLUCOSE: 105 mg/dL — AB (ref 70–99)
POTASSIUM: 3.9 mmol/L (ref 3.5–5.3)
SODIUM: 141 mmol/L (ref 135–146)
Total Protein: 6.8 g/dL (ref 6.1–8.1)

## 2016-01-02 LAB — TSH: TSH: 3.05 m[IU]/L

## 2016-01-02 LAB — VITAMIN B12: VITAMIN B 12: 411 pg/mL (ref 200–1100)

## 2016-01-02 LAB — VITAMIN D 25 HYDROXY (VIT D DEFICIENCY, FRACTURES): Vit D, 25-Hydroxy: 20 ng/mL — ABNORMAL LOW (ref 30–100)

## 2016-01-03 ENCOUNTER — Telehealth: Payer: Self-pay | Admitting: Family Medicine

## 2016-01-03 DIAGNOSIS — E559 Vitamin D deficiency, unspecified: Secondary | ICD-10-CM

## 2016-01-03 LAB — CULTURE, GROUP A STREP: ORGANISM ID, BACTERIA: NORMAL

## 2016-01-03 MED ORDER — GABAPENTIN 300 MG PO CAPS: 300.0000 mg | ORAL_CAPSULE | Freq: Three times a day (TID) | ORAL | Status: DC

## 2016-01-03 NOTE — Telephone Encounter (Signed)
She had MRI lumbar spine 06/14/2015 that showed bulging discs.  Suspect that her symptoms are secondary to this.  Recommend Gabapentin 300mg   Day 1:   300mg  po QHS ---  Day 2:   300mg   1 po BID Then take 1 po TID # 90 + 3

## 2016-01-03 NOTE — Telephone Encounter (Signed)
Patient aware of providers recommendations via vm and med sent to pharm 

## 2016-01-03 NOTE — Telephone Encounter (Signed)
-----   Message from Orlena Sheldon, PA-C sent at 01/02/2016  7:55 AM EDT ----- In regards to the paresthesias in her legs--- tell her that she has no anemia, thyroid was normal, B12 was normal. Vitamin D is slightly low. Add vitamin D deficiency to problem list. Tell her to start over-the-counter vitamin D 2000 units daily and add this to med list. Tell her that all other labs normal.

## 2016-01-03 NOTE — Telephone Encounter (Signed)
Pt aware of lab results and provider recommendations.  Still having issues with legs, tingling.  What else do you recommend?

## 2016-01-20 ENCOUNTER — Encounter: Payer: Self-pay | Admitting: Internal Medicine

## 2016-01-20 ENCOUNTER — Ambulatory Visit (INDEPENDENT_AMBULATORY_CARE_PROVIDER_SITE_OTHER): Payer: Commercial Managed Care - HMO | Admitting: Internal Medicine

## 2016-01-20 VITALS — BP 140/80 | HR 105 | Ht 68.5 in | Wt 169.0 lb

## 2016-01-20 DIAGNOSIS — K219 Gastro-esophageal reflux disease without esophagitis: Secondary | ICD-10-CM

## 2016-01-20 DIAGNOSIS — R140 Abdominal distension (gaseous): Secondary | ICD-10-CM

## 2016-01-20 DIAGNOSIS — Z1211 Encounter for screening for malignant neoplasm of colon: Secondary | ICD-10-CM

## 2016-01-20 MED ORDER — NA SULFATE-K SULFATE-MG SULF 17.5-3.13-1.6 GM/177ML PO SOLN: 1.0000 | Freq: Once | ORAL | Status: DC

## 2016-01-20 NOTE — Patient Instructions (Signed)

## 2016-01-20 NOTE — Progress Notes (Signed)
HISTORY OF PRESENT ILLNESS:  Kathryn Murray is a 55 y.o. female, retired from [employer], who is referred by her gynecology provider Doreen Beam PA-C with chief complaints of abdominal bloating, heartburn, and the need for screening colonoscopy. Patient has had intermittent problems with heartburn dating back to the beginning of this year. In the past she had been on Nexium. More recently treating with natural vinegar agent on demand. She feels this helps. There is no dysphagia or vomiting. She also mentions nonspecific bloating with gas and occasional constipation. No family history of colon cancer in first-degree relatives. Review of outside blood work from 01/01/2016 finds a unremarkable CBC and comprehensive metabolic panel.  REVIEW OF SYSTEMS:  All non-GI ROS negative upon review  Past Medical History  Diagnosis Date  . Wears glasses     reading  . ADD (attention deficit disorder)   . GERD (gastroesophageal reflux disease)     Past Surgical History  Procedure Laterality Date  . Abdominal hysterectomy  2006  . Breast surgery      right br bx x2  . Ligament repair Right 01/03/2014    Procedure: RIGHT LONG REPAIR VS RECONSTRUCTION OF RADIAL COLLATERAL LIGAMENT ;  Surgeon: Schuyler Amor, MD;  Location: [facility];  Service: Orthopedics;  Laterality: Right;    Social History Kathryn Murray  reports that she has never smoked. She has never used smokeless tobacco. She reports that she drinks alcohol. She reports that she does not use illicit drugs.  family history is not on file.  Allergies  Allergen Reactions  . Sulphadimidine [Sulfamethazine] Rash       PHYSICAL EXAMINATION: Vital signs: BP 140/80 mmHg  Pulse 105  Ht 5' 8.5" (1.74 m)  Wt 169 lb (76.658 kg)  BMI 25.32 kg/m2  Constitutional: generally well-appearing, no acute distress Psychiatric: alert and oriented x3, cooperative Eyes: extraocular movements intact, anicteric, conjunctiva  pink Mouth: oral pharynx moist, no lesions Neck: supple no lymphadenopathy Cardiovascular: heart regular rate and rhythm, no murmur Lungs: clear to auscultation bilaterally Abdomen: soft, nontender, nondistended, no obvious ascites, no peritoneal signs, normal bowel sounds, no organomegaly Rectal:Deferred until colonoscopy Extremities: no clubbing cyanosis or lower extremity edema bilaterally Skin: no lesions on visible extremities Neuro: No focal deficits. Cranial nerves intact  ASSESSMENT:  #1. GERD without alarm features. #2. Bloating without alarm features #3. Screening colonoscopy. Appropriate candidate without contraindication   PLAN:  #1. Reflux precautions #2. Okay to use on demand vinegar preparation to control symptoms of GERD #3. Schedule screening colonoscopy.The nature of the procedure, as well as the risks, benefits, and alternatives were carefully and thoroughly reviewed with the patient. Ample time for discussion and questions allowed. The patient understood, was satisfied, and agreed to proceed.  A copy of this consultation note has been sent to Ms. Dixon

## 2016-01-24 ENCOUNTER — Encounter: Payer: Self-pay | Admitting: Internal Medicine

## 2016-02-07 ENCOUNTER — Encounter: Payer: Commercial Managed Care - HMO | Admitting: Internal Medicine

## 2016-03-06 ENCOUNTER — Encounter: Payer: Self-pay | Admitting: Internal Medicine

## 2016-03-06 ENCOUNTER — Ambulatory Visit (AMBULATORY_SURGERY_CENTER): Payer: Commercial Managed Care - HMO | Admitting: Internal Medicine

## 2016-03-06 VITALS — BP 99/51 | HR 76 | Temp 98.2°F | Resp 11 | Ht 68.5 in | Wt 169.0 lb

## 2016-03-06 DIAGNOSIS — D120 Benign neoplasm of cecum: Secondary | ICD-10-CM

## 2016-03-06 DIAGNOSIS — K635 Polyp of colon: Secondary | ICD-10-CM | POA: Diagnosis not present

## 2016-03-06 DIAGNOSIS — Z1211 Encounter for screening for malignant neoplasm of colon: Secondary | ICD-10-CM | POA: Diagnosis present

## 2016-03-06 DIAGNOSIS — D124 Benign neoplasm of descending colon: Secondary | ICD-10-CM | POA: Diagnosis not present

## 2016-03-06 MED ORDER — SODIUM CHLORIDE 0.9 % IV SOLN: 500.0000 mL | INTRAVENOUS | Status: DC

## 2016-03-06 NOTE — Progress Notes (Signed)
Report to PACU, RN, vss, BBS= Clear.  

## 2016-03-06 NOTE — Op Note (Signed)
[facility] Patient Name: Kathryn Murray Procedure Date: 03/06/2016 2:00 PM MRN: [id number] Endoscopist: Docia Chuck. Henrene Pastor , MD Age: 55 Referring MD:  Date of Birth: 08/01/1960 Gender: Female Account #: [id number] Procedure:                Colonoscopy, with cold snare polypectomy X2 Indications:              Screening for colorectal malignant neoplasm Medicines:                Monitored Anesthesia Care Procedure:                Pre-Anesthesia Assessment:                           - Prior to the procedure, a History and Physical                            was performed, and patient medications and                            allergies were reviewed. The patient's tolerance of                            previous anesthesia was also reviewed. The risks                            and benefits of the procedure and the sedation                            options and risks were discussed with the patient.                            All questions were answered, and informed consent                            was obtained. Prior Anticoagulants: The patient has                            taken no previous anticoagulant or antiplatelet                            agents. ASA Grade Assessment: I - A normal, healthy                            patient. After reviewing the risks and benefits,                            the patient was deemed in satisfactory condition to                            undergo the procedure.                           After obtaining informed consent, the colonoscope  was passed under direct vision. Throughout the                            procedure, the patient's blood pressure, pulse, and                            oxygen saturations were monitored continuously. The                            Model CF-HQ190L [id number]) scope was introduced                            through the anus and advanced to the the cecum,   identified by appendiceal orifice and ileocecal                            valve. The ileocecal valve, appendiceal orifice,                            and rectum were photographed. The quality of the                            bowel preparation was excellent. The colonoscopy                            was performed without difficulty. The patient                            tolerated the procedure well. The bowel preparation                            used was SUPREP. Scope In: 2:03:26 PM Scope Out: 2:18:15 PM Scope Withdrawal Time: 0 hours 11 minutes 31 seconds  Total Procedure Duration: 0 hours 14 minutes 49 seconds  Findings:                 Two polyps were found in the descending colon and                            cecum. The polyps were 1 to 2 mm in size. These                            polyps were removed with a cold snare. Resection                            and retrieval were complete.                           Internal hemorrhoids were found during retroflexion.                           The exam was otherwise without abnormality on                            direct  and retroflexion views. Complications:            No immediate complications. Estimated blood loss:                            None. Estimated Blood Loss:     Estimated blood loss: none. Impression:               - Two 1 to 2 mm polyps in the descending colon and                            in the cecum, removed with a cold snare. Resected                            and retrieved.                           - Internal hemorrhoids.                           - The examination was otherwise normal on direct                            and retroflexion views. Recommendation:           - Repeat colonoscopy in 5-10 years for surveillance.                           - Patient has a contact number available for                            emergencies. The signs and symptoms of potential                            delayed  complications were discussed with the                            patient. Return to normal activities tomorrow.                            Written discharge instructions were provided to the                            patient.                           - Resume previous diet.                           - Continue present medications.                           - Await pathology results. Docia Chuck. Henrene Pastor, MD 03/06/2016 2:23:44 PM This report has been signed electronically.

## 2016-03-06 NOTE — Progress Notes (Signed)
Called to room to assist during endoscopic procedure.  Patient ID and intended procedure confirmed with present staff. Received instructions for my participation in the procedure from the performing physician.  

## 2016-03-06 NOTE — Patient Instructions (Signed)
YOU HAD AN ENDOSCOPIC PROCEDURE TODAY AT THE [facility]:   Refer to the procedure report that was given to you for any specific questions about what was found during the examination.  If the procedure report does not answer your questions, please call your gastroenterologist to clarify.  If you requested that your care partner not be given the details of your procedure findings, then the procedure report has been included in a sealed envelope for you to review at your convenience later.  YOU SHOULD EXPECT: Some feelings of bloating in the abdomen. Passage of more gas than usual.  Walking can help get rid of the air that was put into your GI tract during the procedure and reduce the bloating. If you had a lower endoscopy (such as a colonoscopy or flexible sigmoidoscopy) you may notice spotting of blood in your stool or on the toilet paper. If you underwent a bowel prep for your procedure, you may not have a normal bowel movement for a few days.  Please Note:  You might notice some irritation and congestion in your nose or some drainage.  This is from the oxygen used during your procedure.  There is no need for concern and it should clear up in a day or so.  SYMPTOMS TO REPORT IMMEDIATELY:   Following lower endoscopy (colonoscopy or flexible sigmoidoscopy):  Excessive amounts of blood in the stool  Significant tenderness or worsening of abdominal pains  Swelling of the abdomen that is new, acute  Fever of 100F or higher   Following upper endoscopy (EGD)  Vomiting of blood or coffee ground material  New chest pain or pain under the shoulder blades  Painful or persistently difficult swallowing  New shortness of breath  Fever of 100F or higher  Black, tarry-looking stools  For urgent or emergent issues, a gastroenterologist can be reached at any hour by calling [phone].   DIET:  We do recommend a small meal at first, but then you may proceed to your regular diet.  Drink  plenty of fluids but you should avoid alcoholic beverages for 24 hours.  ACTIVITY:  You should plan to take it easy for the rest of today and you should NOT DRIVE or use heavy machinery until tomorrow (because of the sedation medicines used during the test).    FOLLOW UP: Our staff will call the number listed on your records the next business day following your procedure to check on you and address any questions or concerns that you may have regarding the information given to you following your procedure. If we do not reach you, we will leave a message.  However, if you are feeling well and you are not experiencing any problems, there is no need to return our call.  We will assume that you have returned to your regular daily activities without incident.  If any biopsies were taken you will be contacted by phone or by letter within the next 1-3 weeks.  Please call us at [phone] if you have not heard about the biopsies in 3 weeks.    SIGNATURES/CONFIDENTIALITY: You and/or your care partner have signed paperwork which will be entered into your electronic medical record.  These signatures attest to the fact that that the information above on your After Visit Summary has been reviewed and is understood.  Full responsibility of the confidentiality of this discharge information lies with you and/or your care-partner.  Polyp and hemorrhoid information given. 

## 2016-03-10 ENCOUNTER — Telehealth: Payer: Self-pay

## 2016-03-10 NOTE — Telephone Encounter (Signed)
Left a message at [phone] for the pt to call us back if she has any questions or concerns. maw

## 2016-03-11 ENCOUNTER — Encounter: Payer: Self-pay | Admitting: Internal Medicine

## 2016-04-06 ENCOUNTER — Other Ambulatory Visit: Payer: Self-pay

## 2016-04-06 ENCOUNTER — Telehealth: Payer: Self-pay | Admitting: Physician Assistant

## 2016-04-06 MED ORDER — AMPHETAMINE-DEXTROAMPHETAMINE 30 MG PO TABS: 30.0000 mg | ORAL_TABLET | Freq: Two times a day (BID) | ORAL | 0 refills | Status: DC

## 2016-04-06 NOTE — Telephone Encounter (Signed)
Patient would like rx for her adderall  [phone]

## 2016-04-06 NOTE — Telephone Encounter (Signed)
RX can be picked up after 2pm on 04-06-16

## 2016-04-06 NOTE — Telephone Encounter (Signed)
Reviewed LOV note.  10/28/2015 I printed set of 3 Rxes.   Approved.  Can print 3 Rxes--One for now, One for 05/07/2016, One for 06/06/2016

## 2016-04-06 NOTE — Telephone Encounter (Signed)
Last OV 6-28 Last refill 10-28-15 Ok to refill?

## 2016-08-13 ENCOUNTER — Telehealth: Payer: Self-pay | Admitting: Physician Assistant

## 2016-08-13 NOTE — Telephone Encounter (Signed)
Last Ov 6-28 Last refill 04-06-2016 Okay to refill

## 2016-08-13 NOTE — Telephone Encounter (Signed)
CB# [phone] Patient left msg requesting a refill on her Adderral.

## 2016-08-14 NOTE — Telephone Encounter (Signed)
Last OV regarding ADD was 10/2015.  Tell her to schedule OV. If cannot come for OV soon, can print one Rx to hold her over.

## 2016-08-14 NOTE — Telephone Encounter (Signed)
Spoke with pt she has sch to be seen in office 2-12

## 2016-08-17 ENCOUNTER — Ambulatory Visit (INDEPENDENT_AMBULATORY_CARE_PROVIDER_SITE_OTHER): Payer: Commercial Managed Care - HMO | Admitting: Physician Assistant

## 2016-08-17 ENCOUNTER — Encounter: Payer: Self-pay | Admitting: Physician Assistant

## 2016-08-17 VITALS — BP 122/78 | HR 86 | Temp 98.2°F | Resp 16 | Wt 172.6 lb

## 2016-08-17 DIAGNOSIS — F988 Other specified behavioral and emotional disorders with onset usually occurring in childhood and adolescence: Secondary | ICD-10-CM | POA: Diagnosis not present

## 2016-08-17 MED ORDER — AMPHETAMINE-DEXTROAMPHETAMINE 30 MG PO TABS: 30.0000 mg | ORAL_TABLET | Freq: Two times a day (BID) | ORAL | 0 refills | Status: DC

## 2016-08-17 NOTE — Progress Notes (Signed)
Patient ID: Kathryn Murray MRN: [id number], DOB: Dec 08, 1960, 56 y.o. Date of Encounter: 08/17/2016, 7:50 PM    Chief Complaint:  Chief Complaint  Patient presents with  . ADHD    med f/u     HPI: 56 y.o. year old white female here for the above.   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 Worked on the computer. Michela Pitcher that she is paid based on productivity. Says that she has to be able to work quickly. She was also doing some photography work as well.  At her visit 10/2015---- Says that that company was having to cut back and they offered her an early retirement package so she took that. 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 Says that her current dose of Adderall continues to work well and is causing no adverse effects.  Since we adjusted to Adderall 30mg  BID, she has reported that this works perfectly. She is able to adjust the timing of when she takes each dose. She is able to keep good coverage regarding having good attention and focus through the entire day, but then not have insomnia at night. Says it continues to work well for her and continues to cause no side effects.     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 Says that since she has not been doing her previous job, does not have to use as much Adderall. Says that she often just takes one per day. Occasionally will take one twice a day. Also is aware of possible risks associated with stimulant use so sometimes even takes 1/2 BID to try to minimize its use. Says that if she tries to go without any Adderall at all, she feels like she has less energy, less focus, and "gets nothing done."  Reviewed that TSH was normal 12/2015. She says she has appt to see her Gyn and plans to talk to Gyn to see if hormones/ menopause contributing to this. She has no family h/o cardiovascular disease. She does not smoke.   Home Meds:    Outpatient Medications  Prior to Visit  Medication Sig Dispense Refill  . cyclobenzaprine (FLEXERIL) 10 MG tablet Take 1 tablet (10 mg total) by mouth 3 (three) times daily as needed for muscle spasms. 30 tablet 1  . estradiol (VIVELLE-DOT) 0.05 MG/24HR patch APPLY 1 PATCH TWICE WEEKLY AS DIRECTED.  12  . ibuprofen (ADVIL,MOTRIN) 600 MG tablet Take 1 tablet (600 mg total) by mouth every 8 (eight) hours as needed. 45 tablet 1  . zolpidem (AMBIEN) 5 MG tablet Take 5 mg by mouth at bedtime as needed.  5  . amphetamine-dextroamphetamine (ADDERALL) 30 MG tablet Take 1 tablet by mouth 2 (two) times daily. 60 tablet 0  . Cholecalciferol (VITAMIN D3) 2000 units TABS Take 1 tablet by mouth daily.     Facility-Administered Medications Prior to Visit  Medication Dose Route Frequency Provider Last Rate Last Dose  . 0.9 %  sodium chloride infusion  500 mL Intravenous Continuous Irene Shipper, MD         Allergies:  Allergies  Allergen Reactions  . Sulphadimidine [Sulfamethazine] Rash      Review of Systems: See HPI for pertinent ROS. All other ROS negative.    Physical Exam: Blood pressure 122/78, pulse 86, temperature 98.2 F (36.8 C), temperature source Oral, resp. rate 16, weight 172 lb 9.6 oz (78.3 kg), SpO2 98 %., Body mass index is 25.86 kg/m.  General: WNWD WF. Appears in no acute distress. Lungs: Clear bilaterally to auscultation without wheezes, rales, or rhonchi. Breathing is unlabored. Heart: Regular rhythm. No murmurs, rubs, or gallops. Msk:  Strength and tone normal for age. Extremities/Skin: Warm and dry.  Neuro: Alert and oriented X 3. Moves all extremities spontaneously. Gait is normal. CNII-XII grossly in tact. Psych:  Responds to questions appropriately with a normal affect.     ASSESSMENT AND PLAN:  56 y.o. year old female with  1. ADD (attention deficit disorder) I Printed 3 prescriptions. One could be filled now, 1 for 09/14/2016, one for 10/15/2016 She will cont to take minimal dose possible.  She will f/u with Gyn as well.  Regular office visit in 6 months or sooner if needed. - amphetamine-dextroamphetamine (ADDERALL) 30 MG tablet; Take 1 tablet (30 mg total) by mouth 2 (two) times daily.  Dispense: 60 tablet; Refill: 0 - amphetamine-dextroamphetamine (ADDERALL) 30 MG tablet; Take 1 tablet (30 mg total) by mouth 2 (two) times daily.  Dispense: 60 tablet; Refill: 0 - amphetamine-dextroamphetamine (ADDERALL) 30 MG tablet; Take 1 tablet (30 mg total) by mouth 2 (two) times daily.  Dispense: 60 tablet; Refill: 0   Signed, [address] [place], [place], [facility] 08/17/2016 7:50 PM

## 2016-10-22 DIAGNOSIS — Z01419 Encounter for gynecological examination (general) (routine) without abnormal findings: Secondary | ICD-10-CM | POA: Diagnosis not present

## 2017-01-05 ENCOUNTER — Other Ambulatory Visit: Payer: Self-pay | Admitting: Physician Assistant

## 2017-01-05 DIAGNOSIS — F988 Other specified behavioral and emotional disorders with onset usually occurring in childhood and adolescence: Secondary | ICD-10-CM

## 2017-01-05 MED ORDER — AMPHETAMINE-DEXTROAMPHETAMINE 30 MG PO TABS: 30.0000 mg | ORAL_TABLET | Freq: Two times a day (BID) | ORAL | 0 refills | Status: DC

## 2017-01-05 NOTE — Telephone Encounter (Signed)
Rx printed patient aware that she can not pick up until after 2pm on 01/07/2017

## 2017-01-05 NOTE — Telephone Encounter (Signed)
Last OV 08/17/2016 Last refill 4/12 Ok to refill?

## 2017-01-05 NOTE — Telephone Encounter (Signed)
Approved--to print set of 3 Rxes----one for 01/05/2017, 02/05/2017, 03/08/2017

## 2017-01-05 NOTE — Telephone Encounter (Signed)
Patient requesting rx for her adderall   [phone]

## 2017-03-30 DIAGNOSIS — N760 Acute vaginitis: Secondary | ICD-10-CM | POA: Diagnosis not present

## 2017-03-30 DIAGNOSIS — N390 Urinary tract infection, site not specified: Secondary | ICD-10-CM | POA: Diagnosis not present

## 2017-05-07 ENCOUNTER — Telehealth: Payer: Self-pay | Admitting: Physician Assistant

## 2017-05-07 NOTE — Telephone Encounter (Signed)
Patient calling to get rx for her adderall  [phone]

## 2017-05-10 NOTE — Telephone Encounter (Signed)
[facility] and schedule an office visit

## 2017-05-10 NOTE — Telephone Encounter (Signed)
Due for OV

## 2017-05-12 ENCOUNTER — Ambulatory Visit: Payer: 59 | Admitting: Physician Assistant

## 2017-05-13 ENCOUNTER — Ambulatory Visit: Payer: Commercial Managed Care - HMO | Admitting: Physician Assistant

## 2017-05-13 ENCOUNTER — Encounter: Payer: Self-pay | Admitting: Physician Assistant

## 2017-05-13 ENCOUNTER — Other Ambulatory Visit: Payer: Self-pay

## 2017-05-13 VITALS — BP 130/84 | HR 93 | Temp 98.4°F | Resp 16 | Ht 69.0 in | Wt 172.6 lb

## 2017-05-13 DIAGNOSIS — F988 Other specified behavioral and emotional disorders with onset usually occurring in childhood and adolescence: Secondary | ICD-10-CM

## 2017-05-13 MED ORDER — AMPHETAMINE-DEXTROAMPHETAMINE 30 MG PO TABS: 30.0000 mg | ORAL_TABLET | Freq: Two times a day (BID) | ORAL | 0 refills | Status: DC

## 2017-05-13 NOTE — Progress Notes (Signed)
Patient ID: Kathryn Murray MRN: [id number], DOB: 28-May-1961, 56 y.o. Date of Encounter: 05/13/2017, 3:07 PM    Chief Complaint:  Chief Complaint  Patient presents with  . Medication Refill     HPI: 56 y.o. year old white female here for the above.   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 Worked on the computer. Michela Pitcher that she is paid based on productivity. Says that she has to be able to work quickly. She was also doing some photography work as well.  At her visit 10/2015---- Says that that company was having to cut back and they offered her an early retirement package so she took that. 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 Says that her current dose of Adderall continues to work well and is causing no adverse effects.  Since we adjusted to Adderall 30mg  BID, she has reported that this works perfectly. She is able to adjust the timing of when she takes each dose. She is able to keep good coverage regarding having good attention and focus through the entire day, but then not have insomnia at night. Says it continues to work well for her and continues to cause no side effects.     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 Says that since she has not been doing her previous job, does not have to use as much Adderall. Says that she often just takes one per day. Occasionally will take one twice a day. Also is aware of possible risks associated with stimulant use so sometimes even takes 1/2 BID to try to minimize its use. Says that if she tries to go without any Adderall at all, she feels like she has less energy, less focus, and "gets nothing done."  Reviewed that TSH was normal 12/2015. She says she has appt to see her Gyn and plans to talk to Gyn to see if hormones/ menopause contributing to this. She has no family h/o cardiovascular disease. She does not smoke.   05/13/2017: Reports that she is continue  to do some photography.  She also has been working on some craft projects and also remodeling her kitchen.  She is trying to decide what she wants to do.  She does continue to take the Adderall some.  Says without the medicine she has a hard time focusing and getting anything accomplished and completing any tasks.  However she does try to minimize amount of Adderall as much as she can.  Patient continues to work well for her and is causing no adverse effects.  No headache, no abdominal pain, no weight loss.  No insomnia.    Home Meds:    Outpatient Medications Prior to Visit  Medication Sig Dispense Refill  . Cholecalciferol (VITAMIN D3) 2000 units TABS Take 1 tablet by mouth daily.    Marland Kitchen estradiol (VIVELLE-DOT) 0.05 MG/24HR patch APPLY 1 PATCH TWICE WEEKLY AS DIRECTED.  12  . zolpidem (AMBIEN) 5 MG tablet Take 5 mg by mouth at bedtime as needed.  5  . amphetamine-dextroamphetamine (ADDERALL) 30 MG tablet Take 1 tablet by mouth 2 (two) times daily. 60 tablet 0  . cyclobenzaprine (FLEXERIL) 10 MG tablet Take 1 tablet (10 mg total) by mouth 3 (three) times daily as needed for muscle spasms. 30 tablet 1  . ibuprofen (ADVIL,MOTRIN) 600 MG tablet Take 1 tablet (600 mg total) by mouth every 8 (eight) hours as needed. 45 tablet 1   Facility-Administered  Medications Prior to Visit  Medication Dose Route Frequency Provider Last Rate Last Dose  . 0.9 %  sodium chloride infusion  500 mL Intravenous Continuous Irene Shipper, MD         Allergies:  Allergies  Allergen Reactions  . Sulphadimidine [Sulfamethazine] Rash      Review of Systems: See HPI for pertinent ROS. All other ROS negative.    Physical Exam: Blood pressure 130/84, pulse 93, temperature 98.4 F (36.9 C), temperature source Oral, resp. rate 16, height 5\' 9"  (1.753 m), weight 78.3 kg (172 lb 9.6 oz), SpO2 98 %., Body mass index is 25.49 kg/m. General: WNWD WF. Appears in no acute distress. Lungs: Clear bilaterally to auscultation  without wheezes, rales, or rhonchi. Breathing is unlabored. Heart: Regular rhythm. No murmurs, rubs, or gallops. Msk:  Strength and tone normal for age. Extremities/Skin: Warm and dry.  Neuro: Alert and oriented X 3. Moves all extremities spontaneously. Gait is normal. CNII-XII grossly in tact. Psych:  Responds to questions appropriately with a normal affect.     ASSESSMENT AND PLAN:  56 y.o. year old female with  1. ADD (attention deficit disorder) I Printed 3 prescriptions. One could be filled now, 1 for 06/12/2017, one for 07/13/2017 She will cont to take minimal dose possible.  Regular office visit in 6 months or sooner if needed. - amphetamine-dextroamphetamine (ADDERALL) 30 MG tablet; Take 1 tablet (30 mg total) by mouth 2 (two) times daily.  Dispense: 60 tablet; Refill: 0 - amphetamine-dextroamphetamine (ADDERALL) 30 MG tablet; Take 1 tablet (30 mg total) by mouth 2 (two) times daily.  Dispense: 60 tablet; Refill: 0 - amphetamine-dextroamphetamine (ADDERALL) 30 MG tablet; Take 1 tablet (30 mg total) by mouth 2 (two) times daily.  Dispense: 60 tablet; Refill: 0   Signed, [address] [place], [place], [facility] 05/13/2017 3:07 PM

## 2017-08-19 ENCOUNTER — Ambulatory Visit: Payer: 59 | Admitting: Physician Assistant

## 2017-08-19 ENCOUNTER — Other Ambulatory Visit: Payer: Self-pay

## 2017-08-19 ENCOUNTER — Encounter: Payer: Self-pay | Admitting: Physician Assistant

## 2017-08-19 VITALS — BP 128/80 | HR 98 | Temp 98.5°F | Resp 14 | Ht 69.0 in | Wt 172.2 lb

## 2017-08-19 DIAGNOSIS — N390 Urinary tract infection, site not specified: Secondary | ICD-10-CM | POA: Diagnosis not present

## 2017-08-19 DIAGNOSIS — R300 Dysuria: Secondary | ICD-10-CM | POA: Diagnosis not present

## 2017-08-19 LAB — URINALYSIS, ROUTINE W REFLEX MICROSCOPIC
Bilirubin Urine: NEGATIVE
GLUCOSE, UA: NEGATIVE
HYALINE CAST: NONE SEEN /LPF
KETONES UR: NEGATIVE
Nitrite: NEGATIVE
Specific Gravity, Urine: 1.025 (ref 1.001–1.03)
pH: 7 (ref 5.0–8.0)

## 2017-08-19 LAB — MICROSCOPIC MESSAGE

## 2017-08-19 MED ORDER — CIPROFLOXACIN HCL 500 MG PO TABS: 500.0000 mg | ORAL_TABLET | Freq: Two times a day (BID) | ORAL | 0 refills | Status: AC

## 2017-08-19 NOTE — Progress Notes (Signed)
Patient ID: Kathryn Murray MRN: [id number], DOB: 12-Jul-1960, 57 y.o. Date of Encounter: 08/19/2017, 11:31 AM    Chief Complaint:  Chief Complaint  Patient presents with  . dysuira    x5day     HPI: 57 y.o. year old female presents with above.   I reviewed with her that her urinalysis is not showing significant definite signs of UTI.   She states that in the past at her GYN that has happened where the urine test did not look very bad but then the culture comes back positive.  She states that the symptoms came on all of a sudden yesterday afternoon.  Last night it was to the point that she ended up just sitting on the toilet for 30 minutes because it kept feeling like she had to urinate but then just a little dribble would come out and then a few seconds later another little dribble.  Says that it also was painful with urination.  She has had no fever.  No pain in her back.     Home Meds:   Outpatient Medications Prior to Visit  Medication Sig Dispense Refill  . amphetamine-dextroamphetamine (ADDERALL) 30 MG tablet Take 1 tablet 2 (two) times daily by mouth. 60 tablet 0  . Cholecalciferol (VITAMIN D3) 2000 units TABS Take 1 tablet by mouth daily.    Marland Kitchen estradiol (VIVELLE-DOT) 0.05 MG/24HR patch APPLY 1 PATCH TWICE WEEKLY AS DIRECTED.  12  . zolpidem (AMBIEN) 5 MG tablet Take 5 mg by mouth at bedtime as needed.  5   Facility-Administered Medications Prior to Visit  Medication Dose Route Frequency Provider Last Rate Last Dose  . 0.9 %  sodium chloride infusion  500 mL Intravenous Continuous Irene Shipper, MD        Allergies:  Allergies  Allergen Reactions  . Sulphadimidine [Sulfamethazine] Rash      Review of Systems: See HPI for pertinent ROS. All other ROS negative.    Physical Exam: Blood pressure 128/80, pulse 98, temperature 98.5 F (36.9 C), temperature source Oral, resp. rate 14, height 5\' 9"  (1.753 m), weight 78.1 kg (172 lb 3.2 oz), SpO2 98 %., Body mass index  is 25.43 kg/m. General: WNWD WF.  Appears in no acute distress. Neck: Supple. No thyromegaly. No lymphadenopathy. Lungs: Clear bilaterally to auscultation without wheezes, rales, or rhonchi. Breathing is unlabored. Heart: Regular rhythm. No murmurs, rubs, or gallops. Abdomen: Soft, non-tender, non-distended with normoactive bowel sounds. No hepatomegaly. No rebound/guarding. No obvious abdominal masses. Msk:  Strength and tone normal for age.  No tenderness with percussion to costophrenic angles bilaterally. Extremities/Skin: Warm and dry.  Neuro: Alert and oriented X 3. Moves all extremities spontaneously. Gait is normal. CNII-XII grossly in tact. Psych:  Responds to questions appropriately with a normal affect.      ASSESSMENT AND PLAN:  57 y.o. year old female with  1. Urinary tract infection without hematuria, site unspecified Urinalysis shows trace leukocytes and it does show 6-10 WBC. Go ahead and start Cipro.  Also will send culture and follow-up that result. She is to take the Cipro as directed.  Follow-up if symptoms do not resolve upon completion of this.  As well we will follow-up with her as we get the urine culture result. - ciprofloxacin (CIPRO) 500 MG tablet; Take 1 tablet (500 mg total) by mouth 2 (two) times daily for 7 days.  Dispense: 14 tablet; Refill: 0 - Urine Culture  2. Dysuria - Urinalysis, Routine w reflex  microscopic   Signed, [name], [place], [facility] 08/19/2017 11:31 AM

## 2017-08-22 LAB — URINE CULTURE
MICRO NUMBER: 90199514
SPECIMEN QUALITY:: ADEQUATE

## 2017-09-15 ENCOUNTER — Other Ambulatory Visit: Payer: Self-pay | Admitting: Physician Assistant

## 2017-09-15 DIAGNOSIS — F988 Other specified behavioral and emotional disorders with onset usually occurring in childhood and adolescence: Secondary | ICD-10-CM

## 2017-09-15 NOTE — Telephone Encounter (Signed)
Patient is calling to get refill on her adderall  [facility]

## 2017-09-15 NOTE — Telephone Encounter (Incomplete)
Last OV 05/13/2017 Last refill 05/13/2017  Ok to refill

## 2017-09-16 ENCOUNTER — Telehealth: Payer: Self-pay

## 2017-09-16 DIAGNOSIS — F988 Other specified behavioral and emotional disorders with onset usually occurring in childhood and adolescence: Secondary | ICD-10-CM

## 2017-09-16 MED ORDER — AMPHETAMINE-DEXTROAMPHETAMINE 30 MG PO TABS: 30.0000 mg | ORAL_TABLET | Freq: Two times a day (BID) | ORAL | 0 refills | Status: DC

## 2017-09-16 NOTE — Telephone Encounter (Signed)
Pharmacy called and left a message that they are out of Adderall 30 mg and need to have the rx sent to [facility] in [place] patient is aware

## 2017-10-29 ENCOUNTER — Other Ambulatory Visit: Payer: Self-pay | Admitting: Physician Assistant

## 2017-10-29 DIAGNOSIS — F988 Other specified behavioral and emotional disorders with onset usually occurring in childhood and adolescence: Secondary | ICD-10-CM

## 2017-10-29 NOTE — Telephone Encounter (Signed)
[facility]  Requesting refill on her adderall

## 2017-11-01 MED ORDER — AMPHETAMINE-DEXTROAMPHETAMINE 30 MG PO TABS: 30.0000 mg | ORAL_TABLET | Freq: Two times a day (BID) | ORAL | 0 refills | Status: DC

## 2017-11-01 NOTE — Telephone Encounter (Signed)
Last OV 05/13/2017 Last refill 09/16/2017 Okay to refill?

## 2017-12-13 ENCOUNTER — Other Ambulatory Visit: Payer: Self-pay | Admitting: Physician Assistant

## 2017-12-13 DIAGNOSIS — F988 Other specified behavioral and emotional disorders with onset usually occurring in childhood and adolescence: Secondary | ICD-10-CM

## 2017-12-13 NOTE — Telephone Encounter (Signed)
Refill on adderall to [facility] [address]

## 2017-12-14 MED ORDER — AMPHETAMINE-DEXTROAMPHETAMINE 30 MG PO TABS: 30.0000 mg | ORAL_TABLET | Freq: Two times a day (BID) | ORAL | 0 refills | Status: DC

## 2017-12-14 NOTE — Telephone Encounter (Signed)
Last OV 08/19/2017 Last refill 4/29 Ok to refill?

## 2018-01-20 DIAGNOSIS — Z01419 Encounter for gynecological examination (general) (routine) without abnormal findings: Secondary | ICD-10-CM | POA: Diagnosis not present

## 2018-02-01 ENCOUNTER — Other Ambulatory Visit: Payer: Self-pay | Admitting: Physician Assistant

## 2018-02-01 DIAGNOSIS — F988 Other specified behavioral and emotional disorders with onset usually occurring in childhood and adolescence: Secondary | ICD-10-CM

## 2018-02-01 NOTE — Telephone Encounter (Signed)
adderall refill to [facility].

## 2018-02-01 NOTE — Telephone Encounter (Signed)
Last OV 05/13/2017 Last refill 12/14/2017 Ok to refill?

## 2018-02-02 MED ORDER — AMPHETAMINE-DEXTROAMPHETAMINE 30 MG PO TABS: 30.0000 mg | ORAL_TABLET | Freq: Two times a day (BID) | ORAL | 0 refills | Status: DC

## 2018-03-21 ENCOUNTER — Other Ambulatory Visit: Payer: Self-pay | Admitting: Physician Assistant

## 2018-03-21 DIAGNOSIS — F988 Other specified behavioral and emotional disorders with onset usually occurring in childhood and adolescence: Secondary | ICD-10-CM

## 2018-03-21 MED ORDER — AMPHETAMINE-DEXTROAMPHETAMINE 30 MG PO TABS: 30.0000 mg | ORAL_TABLET | Freq: Two times a day (BID) | ORAL | 0 refills | Status: DC

## 2018-03-21 NOTE — Telephone Encounter (Signed)
Last ov 05/13/2017 Last refill 02/02/2018 Ok to refill?

## 2018-03-21 NOTE — Telephone Encounter (Signed)
Refill adderall to [facility] [address].

## 2018-05-04 ENCOUNTER — Telehealth: Payer: Self-pay | Admitting: Physician Assistant

## 2018-05-04 DIAGNOSIS — F988 Other specified behavioral and emotional disorders with onset usually occurring in childhood and adolescence: Secondary | ICD-10-CM

## 2018-05-04 NOTE — Telephone Encounter (Signed)
Patient would like her adderall sent to [facility] if possible

## 2018-05-06 MED ORDER — AMPHETAMINE-DEXTROAMPHETAMINE 30 MG PO TABS: 30.0000 mg | ORAL_TABLET | Freq: Two times a day (BID) | ORAL | 0 refills | Status: DC

## 2018-06-07 ENCOUNTER — Telehealth: Payer: Self-pay | Admitting: Physician Assistant

## 2018-06-07 DIAGNOSIS — F988 Other specified behavioral and emotional disorders with onset usually occurring in childhood and adolescence: Secondary | ICD-10-CM

## 2018-06-07 MED ORDER — AMPHETAMINE-DEXTROAMPHETAMINE 30 MG PO TABS: 30.0000 mg | ORAL_TABLET | Freq: Two times a day (BID) | ORAL | 0 refills | Status: DC

## 2018-06-07 NOTE — Telephone Encounter (Signed)
Patient needs adderall sent to [facility]  [phone]

## 2018-08-05 ENCOUNTER — Other Ambulatory Visit: Payer: Self-pay | Admitting: Physician Assistant

## 2018-08-05 DIAGNOSIS — F988 Other specified behavioral and emotional disorders with onset usually occurring in childhood and adolescence: Secondary | ICD-10-CM

## 2018-08-05 MED ORDER — AMPHETAMINE-DEXTROAMPHETAMINE 30 MG PO TABS: 30.0000 mg | ORAL_TABLET | Freq: Two times a day (BID) | ORAL | 0 refills | Status: DC

## 2018-08-05 NOTE — Telephone Encounter (Signed)
Ok to refill??  Last office visit 08/19/2017.  Last refill 06/07/2018.  Of note, letter sent to advise patient needs to establish with new provider.

## 2018-08-05 NOTE — Telephone Encounter (Signed)
Patient needs refill on her adderall  [facility]

## 2018-09-24 ENCOUNTER — Telehealth: Payer: Self-pay | Admitting: Family Medicine

## 2018-09-24 MED ORDER — NAPROXEN 500 MG PO TABS: 500.0000 mg | ORAL_TABLET | Freq: Two times a day (BID) | ORAL | 1 refills | Status: DC

## 2018-09-24 MED ORDER — OXYCODONE-ACETAMINOPHEN 5-325 MG PO TABS: 1.0000 | ORAL_TABLET | Freq: Four times a day (QID) | ORAL | 0 refills | Status: DC | PRN

## 2018-09-24 MED ORDER — CYCLOBENZAPRINE HCL 10 MG PO TABS: 10.0000 mg | ORAL_TABLET | Freq: Three times a day (TID) | ORAL | 0 refills | Status: DC | PRN

## 2018-09-24 NOTE — Telephone Encounter (Signed)
Pt called, Friday AM rolled over in bed, felt a painful sensation in upper back, since then has had constant waves of left shoulder blade and arm pain. Used ICE/HEAT, ibuprofen , old flexeril from 2015, but pain not improving.  Able to use arm normally, no tingling numbness in her hand, no pain in neck  Will treat as possible strain and pinched nerve based on mechanism of injury  Given Naprosyn 500mg  BID  Flexeril 10mg    Percocet 5-325mg   Continue ICE/HEAT   Please call and triage her Monday, see if she needs to come in to see Dr. Dennard Schaumann

## 2018-09-26 NOTE — Telephone Encounter (Signed)
Definitely NTBS

## 2018-09-26 NOTE — Telephone Encounter (Signed)
LMTRC

## 2018-09-26 NOTE — Telephone Encounter (Signed)
Patient returned call.   States that she continues to have pain in back. Reports that medications ease pain, but wear off around 3 hours.   States that she has noticed some increased weakness in hand. OV scheduled for 09/27/2018.

## 2018-09-27 ENCOUNTER — Encounter: Payer: Self-pay | Admitting: Family Medicine

## 2018-09-27 ENCOUNTER — Other Ambulatory Visit: Payer: Self-pay

## 2018-09-27 ENCOUNTER — Ambulatory Visit (INDEPENDENT_AMBULATORY_CARE_PROVIDER_SITE_OTHER): Payer: 59 | Admitting: Family Medicine

## 2018-09-27 VITALS — BP 152/90 | HR 100 | Temp 97.6°F | Resp 14 | Ht 68.5 in | Wt 174.0 lb

## 2018-09-27 DIAGNOSIS — F988 Other specified behavioral and emotional disorders with onset usually occurring in childhood and adolescence: Secondary | ICD-10-CM | POA: Diagnosis not present

## 2018-09-27 DIAGNOSIS — M5412 Radiculopathy, cervical region: Secondary | ICD-10-CM | POA: Diagnosis not present

## 2018-09-27 MED ORDER — AMPHETAMINE-DEXTROAMPHETAMINE 30 MG PO TABS: 30.0000 mg | ORAL_TABLET | Freq: Two times a day (BID) | ORAL | 0 refills | Status: DC

## 2018-09-27 MED ORDER — OXYCODONE-ACETAMINOPHEN 5-325 MG PO TABS: 1.0000 | ORAL_TABLET | Freq: Four times a day (QID) | ORAL | 0 refills | Status: DC | PRN

## 2018-09-27 MED ORDER — PREDNISONE 20 MG PO TABS: ORAL_TABLET | ORAL | 0 refills | Status: DC

## 2018-09-27 NOTE — Progress Notes (Signed)
Subjective:    Patient ID: Kathryn Murray, female    DOB: [birth year], 58 y.o.   MRN: [id number]  HPI  Earlier this week, the patient rolled over in bed and felt a sudden pain in the posterior aspect of her neck on the left-hand side.  Pain radiated into her left shoulder and all the way down her left arm.  She called her doctor on call and was started on Naprosyn as well as Percocet for pain.  She is here today for follow-up.  She now reports some numbness and tingling in her left hand.  She also reports decreased strength in her left hand.  Today on examination finger abduction of the interosseous muscles is definitely weaker in the left hand.  She still has normal grip strength.  She has normal wrist flexion and extension.  Elbow flexion and extension is normal.  She has normal reflexes checked at the biceps, brachioradialis, and triceps.  She has a positive Spurling sign to the left.  She has no tenderness to palpation over the spinous processes or cervical paraspinal muscles.  She has a negative Hawkins sign.  She has a negative empty can sign.  Past Medical History:  Diagnosis Date  . ADD (attention deficit disorder)   . GERD (gastroesophageal reflux disease)   . Wears glasses    reading   Past Surgical History:  Procedure Laterality Date  . ABDOMINAL HYSTERECTOMY  2006  . BREAST SURGERY     right br bx x2  . LIGAMENT REPAIR Right 01/03/2014   Procedure: RIGHT LONG REPAIR VS RECONSTRUCTION OF RADIAL COLLATERAL LIGAMENT ;  Surgeon: Schuyler Amor, MD;  Location: [place];  Service: Orthopedics;  Laterality: Right;   Current Outpatient Medications on File Prior to Visit  Medication Sig Dispense Refill  . amphetamine-dextroamphetamine (ADDERALL) 30 MG tablet Take 1 tablet by mouth 2 (two) times daily. 60 tablet 0  . Cholecalciferol (VITAMIN D3) 2000 units TABS Take 1 tablet by mouth daily.    . cyclobenzaprine (FLEXERIL) 10 MG tablet Take 1 tablet (10 mg total) by  mouth 3 (three) times daily as needed for muscle spasms. 20 tablet 0  . estradiol (VIVELLE-DOT) 0.05 MG/24HR patch APPLY 1 PATCH TWICE WEEKLY AS DIRECTED.  12  . naproxen (NAPROSYN) 500 MG tablet Take 1 tablet (500 mg total) by mouth 2 (two) times daily with a meal. 60 tablet 1  . oxyCODONE-acetaminophen (PERCOCET) 5-325 MG tablet Take 1 tablet by mouth every 6 (six) hours as needed for severe pain. 20 tablet 0  . zolpidem (AMBIEN) 5 MG tablet Take 5 mg by mouth at bedtime as needed.  5   Current Facility-Administered Medications on File Prior to Visit  Medication Dose Route Frequency Provider Last Rate Last Dose  . 0.9 %  sodium chloride infusion  500 mL Intravenous Continuous Irene Shipper, MD       Allergies  Allergen Reactions  . Sulphadimidine [Sulfamethazine] Rash   Social History   Socioeconomic History  . Marital status: Married    Spouse name: Not on file  . Number of children: Not on file  . Years of education: Not on file  . Highest education level: Not on file  Occupational History  . Not on file  Social Needs  . Financial resource strain: Not on file  . Food insecurity:    Worry: Not on file    Inability: Not on file  . Transportation needs:    Medical: Not  on file    Non-medical: Not on file  Tobacco Use  . Smoking status: Never Smoker  . Smokeless tobacco: Never Used  Substance and Sexual Activity  . Alcohol use: Yes    Alcohol/week: 0.0 standard drinks    Comment: Wine twice a week  . Drug use: No  . Sexual activity: Not on file  Lifestyle  . Physical activity:    Days per week: Not on file    Minutes per session: Not on file  . Stress: Not on file  Relationships  . Social connections:    Talks on phone: Not on file    Gets together: Not on file    Attends religious service: Not on file    Active member of club or organization: Not on file    Attends meetings of clubs or organizations: Not on file    Relationship status: Not on file  . Intimate  partner violence:    Fear of current or ex partner: Not on file    Emotionally abused: Not on file    Physically abused: Not on file    Forced sexual activity: Not on file  Other Topics Concern  . Not on file  Social History Narrative  . Not on file      Review of Systems  All other systems reviewed and are negative.      Objective:   Physical Exam Vitals signs reviewed.  Constitutional:      Appearance: Normal appearance. She is not ill-appearing, toxic-appearing or diaphoretic.  Cardiovascular:     Rate and Rhythm: Normal rate and regular rhythm.     Pulses: Normal pulses.     Heart sounds: Normal heart sounds. No murmur.  Pulmonary:     Effort: Pulmonary effort is normal.     Breath sounds: Normal breath sounds. No wheezing, rhonchi or rales.  Musculoskeletal:     Cervical back: She exhibits decreased range of motion and pain. She exhibits no tenderness, no bony tenderness and no spasm.       Back:  Neurological:     General: No focal deficit present.     Mental Status: She is alert and oriented to person, place, and time. Mental status is at baseline.     Cranial Nerves: No cranial nerve deficit.     Sensory: No sensory deficit.     Motor: No weakness.     Deep Tendon Reflexes: Reflexes normal.           Assessment & Plan:  Cervical radiculopathy - Plan: DG Cervical Spine Complete  Attention deficit disorder (ADD) without hyperactivity - Plan: amphetamine-dextroamphetamine (ADDERALL) 30 MG tablet  Patient's history and physical are consistent with cervical radiculopathy I suspect due to herniated disc most likely between C6-C7 or C7-T1.  Obtain an x-ray of the cervical spine to evaluate further.  Start the patient on a prednisone taper pack.  I will refill her Percocet as she is almost out.  She is in tremendous pain radiating down her left arm typically when she has her arm in a dependent hanging position.  If she keeps her shoulder abducted with her arm  behind her head, the pain in her arm improves which suggest pain is increased with nerve traction due to the weight of the upper extremity.  I did refill her Adderall which she has been taking 30 mg twice daily for ADD.  This is previously been prescribed by my partner who is since retired.  Recheck in 1 week  or sooner if worse

## 2018-10-05 ENCOUNTER — Telehealth: Payer: Self-pay | Admitting: Family Medicine

## 2018-10-05 NOTE — Telephone Encounter (Signed)
Pt finished prednisone and she got better but within the last few days the pain has returned and was wondering if she needed another round of pred?

## 2018-10-06 ENCOUNTER — Other Ambulatory Visit: Payer: Self-pay | Admitting: Family Medicine

## 2018-10-06 MED ORDER — PREDNISONE 20 MG PO TABS: ORAL_TABLET | ORAL | 0 refills | Status: DC

## 2018-10-06 NOTE — Telephone Encounter (Signed)
[facility]  If no better, next step would be xray of c-spine

## 2018-10-06 NOTE — Telephone Encounter (Signed)
Patient aware of providers recommendations.  

## 2018-10-14 ENCOUNTER — Other Ambulatory Visit: Payer: Self-pay

## 2018-10-14 ENCOUNTER — Ambulatory Visit
Admission: RE | Admit: 2018-10-14 | Discharge: 2018-10-14 | Disposition: A | Discharge status: Home / Self Care | Payer: 59 | Source: Ambulatory Visit | Attending: Family Medicine | Admitting: Family Medicine

## 2018-10-14 DIAGNOSIS — M542 Cervicalgia: Secondary | ICD-10-CM | POA: Diagnosis not present

## 2018-10-14 DIAGNOSIS — M5412 Radiculopathy, cervical region: Secondary | ICD-10-CM

## 2018-10-19 ENCOUNTER — Other Ambulatory Visit: Payer: Self-pay | Admitting: Family Medicine

## 2018-10-19 DIAGNOSIS — M5412 Radiculopathy, cervical region: Secondary | ICD-10-CM

## 2018-10-19 NOTE — Progress Notes (Signed)
MRI

## 2018-11-29 ENCOUNTER — Ambulatory Visit (INDEPENDENT_AMBULATORY_CARE_PROVIDER_SITE_OTHER): Payer: 59 | Admitting: Family Medicine

## 2018-11-29 ENCOUNTER — Other Ambulatory Visit: Payer: Self-pay | Admitting: Family Medicine

## 2018-11-29 ENCOUNTER — Other Ambulatory Visit: Payer: Self-pay

## 2018-11-29 DIAGNOSIS — M5431 Sciatica, right side: Secondary | ICD-10-CM | POA: Diagnosis not present

## 2018-11-29 MED ORDER — OXYCODONE-ACETAMINOPHEN 5-325 MG PO TABS: 1.0000 | ORAL_TABLET | Freq: Four times a day (QID) | ORAL | 0 refills | Status: DC | PRN

## 2018-11-29 NOTE — Progress Notes (Signed)
Subjective:    Patient ID: Kathryn Murray, female    DOB: 01/22/1961, 58 y.o.   MRN: [id number]  HPI Patient is being seen today as a telephone visit.  Phone call began at 248.  Phone call concluded at 3:00.  Patient was at home.  I am currently in my office.  Patient consents to be seen by telephone.  Patient has a history of lumbar degenerative disc disease.  In 2016 she had a MRI of the lumbar spine that revealed herniated disc to the left causing left-sided sciatica.  This gradually improved with time however over the last week she has developed severe sharp lower back pain at the same level however now is radiating into her right gluteus and down her right leg.  She denies any injury she reports constant low back pain that is severe.  It is worsened over the last 3 days.  She has basically been laying in bed unable to move for the last 3 days due to the severity of the pain.  It radiates into her right gluteus from approximately L4-L5 level.  She also reports a shooting pain down her right leg.  She denies any weakness in her right leg.  She denies any cauda equina syndrome symptoms.  She denies any rash.  She denies any bowel or bladder incontinence. Past Medical History:  Diagnosis Date  . ADD (attention deficit disorder)   . GERD (gastroesophageal reflux disease)   . Wears glasses    reading   Past Surgical History:  Procedure Laterality Date  . ABDOMINAL HYSTERECTOMY  2006  . BREAST SURGERY     right br bx x2  . LIGAMENT REPAIR Right 01/03/2014   Procedure: RIGHT LONG REPAIR VS RECONSTRUCTION OF RADIAL COLLATERAL LIGAMENT ;  Surgeon: Schuyler Amor, MD;  Location: [place];  Service: Orthopedics;  Laterality: Right;   Current Outpatient Medications on File Prior to Visit  Medication Sig Dispense Refill  . amphetamine-dextroamphetamine (ADDERALL) 30 MG tablet Take 1 tablet by mouth 2 (two) times daily. 60 tablet 0  . Cholecalciferol (VITAMIN D3) 2000 units TABS  Take 1 tablet by mouth daily.    . cyclobenzaprine (FLEXERIL) 10 MG tablet Take 1 tablet (10 mg total) by mouth 3 (three) times daily as needed for muscle spasms. 20 tablet 0  . estradiol (VIVELLE-DOT) 0.05 MG/24HR patch APPLY 1 PATCH TWICE WEEKLY AS DIRECTED.  12  . naproxen (NAPROSYN) 500 MG tablet Take 1 tablet (500 mg total) by mouth 2 (two) times daily with a meal. 60 tablet 1  . predniSONE (DELTASONE) 20 MG tablet 3 tabs poqday 1-2, 2 tabs poqday 3-4, 1 tab poqday 5-6 12 tablet 0  . predniSONE (DELTASONE) 20 MG tablet 3 tabs poqday 1-2, 2 tabs poqday 3-4, 1 tab poqday 5-6 12 tablet 0  . zolpidem (AMBIEN) 5 MG tablet Take 5 mg by mouth at bedtime as needed.  5   Current Facility-Administered Medications on File Prior to Visit  Medication Dose Route Frequency Provider Last Rate Last Dose  . 0.9 %  sodium chloride infusion  500 mL Intravenous Continuous Irene Shipper, MD       Allergies  Allergen Reactions  . Sulphadimidine [Sulfamethazine] Rash   Social History   Socioeconomic History  . Marital status: Married    Spouse name: Not on file  . Number of children: Not on file  . Years of education: Not on file  . Highest education level: Not on file  Occupational History  . Not on file  Social Needs  . Financial resource strain: Not on file  . Food insecurity:    Worry: Not on file    Inability: Not on file  . Transportation needs:    Medical: Not on file    Non-medical: Not on file  Tobacco Use  . Smoking status: Never Smoker  . Smokeless tobacco: Never Used  Substance and Sexual Activity  . Alcohol use: Yes    Alcohol/week: 0.0 standard drinks    Comment: Wine twice a week  . Drug use: No  . Sexual activity: Not on file  Lifestyle  . Physical activity:    Days per week: Not on file    Minutes per session: Not on file  . Stress: Not on file  Relationships  . Social connections:    Talks on phone: Not on file    Gets together: Not on file    Attends religious  service: Not on file    Active member of club or organization: Not on file    Attends meetings of clubs or organizations: Not on file    Relationship status: Not on file  . Intimate partner violence:    Fear of current or ex partner: Not on file    Emotionally abused: Not on file    Physically abused: Not on file    Forced sexual activity: Not on file  Other Topics Concern  . Not on file  Social History Narrative  . Not on file      Review of Systems  All other systems reviewed and are negative.      Objective:   Physical Exam   Physical exam was not performed today as patient was seen over the telephone     Assessment & Plan:  Right sided sciatica  Patient's history sounds consistent with right-sided sciatica presumably secondary to a herniated disc in the lumbar spine.  I recommended naproxen 500 mg twice daily with rest and no heavy lifting.  She can use Percocet 5/325 1 p.o. every 6 hours as needed breakthrough pain.  Consider prednisone taper pack if radicular symptoms worsen.  Seek medical attention immediately should she develop symptoms of cauda equina syndrome.  Patient is comfortable with this plan

## 2018-12-05 ENCOUNTER — Other Ambulatory Visit: Payer: Self-pay

## 2018-12-05 ENCOUNTER — Ambulatory Visit
Admission: RE | Admit: 2018-12-05 | Discharge: 2018-12-05 | Disposition: A | Discharge status: Home / Self Care | Payer: 59 | Source: Ambulatory Visit | Attending: Family Medicine | Admitting: Family Medicine

## 2018-12-05 DIAGNOSIS — M5412 Radiculopathy, cervical region: Secondary | ICD-10-CM

## 2018-12-15 ENCOUNTER — Other Ambulatory Visit: Payer: Self-pay | Admitting: Family Medicine

## 2018-12-15 DIAGNOSIS — F988 Other specified behavioral and emotional disorders with onset usually occurring in childhood and adolescence: Secondary | ICD-10-CM

## 2018-12-15 NOTE — Telephone Encounter (Signed)
Refill on adderall to [facility]

## 2018-12-16 NOTE — Telephone Encounter (Signed)
Pt requesting refill on Adderall      LOV: 11/29/18  LRF:   09/27/18

## 2018-12-19 ENCOUNTER — Other Ambulatory Visit: Payer: Self-pay | Admitting: Obstetrics and Gynecology

## 2018-12-19 DIAGNOSIS — N6320 Unspecified lump in the left breast, unspecified quadrant: Secondary | ICD-10-CM

## 2018-12-19 MED ORDER — AMPHETAMINE-DEXTROAMPHETAMINE 30 MG PO TABS: 30.0000 mg | ORAL_TABLET | Freq: Two times a day (BID) | ORAL | 0 refills | Status: DC

## 2018-12-27 ENCOUNTER — Other Ambulatory Visit: Payer: 59

## 2018-12-27 ENCOUNTER — Ambulatory Visit
Admission: RE | Admit: 2018-12-27 | Discharge: 2018-12-27 | Disposition: A | Discharge status: Home / Self Care | Payer: 59 | Source: Ambulatory Visit | Attending: Obstetrics and Gynecology | Admitting: Obstetrics and Gynecology

## 2018-12-27 DIAGNOSIS — N6320 Unspecified lump in the left breast, unspecified quadrant: Secondary | ICD-10-CM

## 2018-12-28 ENCOUNTER — Other Ambulatory Visit: Payer: Self-pay | Admitting: Family Medicine

## 2018-12-28 DIAGNOSIS — E041 Nontoxic single thyroid nodule: Secondary | ICD-10-CM

## 2018-12-28 DIAGNOSIS — M5412 Radiculopathy, cervical region: Secondary | ICD-10-CM

## 2018-12-28 DIAGNOSIS — M4802 Spinal stenosis, cervical region: Secondary | ICD-10-CM

## 2019-01-10 ENCOUNTER — Other Ambulatory Visit: Payer: Self-pay

## 2019-01-10 ENCOUNTER — Ambulatory Visit
Admission: RE | Admit: 2019-01-10 | Discharge: 2019-01-10 | Disposition: A | Discharge status: Home / Self Care | Payer: 59 | Source: Ambulatory Visit | Attending: Family Medicine | Admitting: Family Medicine

## 2019-01-10 ENCOUNTER — Encounter: Payer: Self-pay | Admitting: Orthopaedic Surgery

## 2019-01-10 ENCOUNTER — Ambulatory Visit (INDEPENDENT_AMBULATORY_CARE_PROVIDER_SITE_OTHER): Payer: 59 | Admitting: Orthopaedic Surgery

## 2019-01-10 DIAGNOSIS — E041 Nontoxic single thyroid nodule: Secondary | ICD-10-CM

## 2019-01-10 DIAGNOSIS — M5020 Other cervical disc displacement, unspecified cervical region: Secondary | ICD-10-CM | POA: Diagnosis not present

## 2019-01-10 DIAGNOSIS — M5136 Other intervertebral disc degeneration, lumbar region: Secondary | ICD-10-CM | POA: Diagnosis not present

## 2019-01-10 NOTE — Progress Notes (Signed)
Office Visit Note   Patient: Kathryn Murray           Date of Birth: Apr 23, 1961           MRN: [id number] Visit Date: 01/10/2019              Requested by: Susy Frizzle, MD [address] [place],  [place] [address] PCP: Susy Frizzle, MD   Assessment & Plan: Visit Diagnoses:  1. Protrusion of cervical intervertebral disc   2. Other intervertebral disc degeneration, lumbar region     Plan: We will set patient up for some home cervical traction since she gets improvement in her symptoms with cervical distraction.  We reviewed the MRI scan and gave her a copy of the report.  She does have significant foraminal stenosis at C5-6 on the left.  Pathophysiology discussed if she develops increased radicular symptoms she can return.  Currently she is not having significant enough symptoms to consider further treatment.   Follow-Up Instructions: Return if symptoms worsen or fail to improve.   Orders:  No orders of the defined types were placed in this encounter.  No orders of the defined types were placed in this encounter.     Procedures: No procedures performed   Clinical Data: No additional findings.   Subjective: Chief Complaint  Patient presents with  . Neck - Pain    HPI 58 year old female here with complaints of neck pain and radicular symptoms.  She is already had x-rays cervical spine as well as an MRI scan.  Patient had no known injury.  She has had symptoms for several years but is gotten significantly worse in the last 3 months.  Today she feels like it somewhat better.  Previously she had pain radiating down her arm.  She denies bowel or bladder symptoms.  No gait disturbance no weakness or falling.  Today she is not really noting any numbness in her arms or hands.  Patient had 2 rounds of prednisone Dosepak 1 in March 20 in April.  Review of Systems 14 point systems positive for vitamin D deficiency, ADD.  Problems with some lumbar disc degeneration  and cervical disc degeneration otherwise negative.  Objective: Vital Signs: Ht 5' 8.5" (1.74 m)   Wt 174 lb (78.9 kg)   BMI 26.07 kg/m   Physical Exam Constitutional:      Appearance: She is well-developed.  HENT:     Head: Normocephalic.     Right Ear: External ear normal.     Left Ear: External ear normal.  Eyes:     Pupils: Pupils are equal, round, and reactive to light.  Neck:     Thyroid: No thyromegaly.     Trachea: No tracheal deviation.  Cardiovascular:     Rate and Rhythm: Normal rate.  Pulmonary:     Effort: Pulmonary effort is normal.  Abdominal:     Palpations: Abdomen is soft.  Skin:    General: Skin is warm and dry.  Neurological:     Mental Status: She is alert and oriented to person, place, and time.  Psychiatric:        Behavior: Behavior normal.     Ortho Exam patient has mild to moderate brachial plexus tenderness on the left mild on the right.  Increased discomfort with compression some relief with cervical distraction.  Upper extremity reflexes are symmetrical no isolated motor weakness biceps triceps wrist flexion extension, supination pronation finger extensors or flexors.  No  lower extremity clonus no hyperreflexia normal heel toe gait.  Specialty Comments:  No specialty comments available.  Imaging: US Thyroid  Result Date: 01/10/2019 CLINICAL DATA:  58 year old female with thyroid nodules EXAM: THYROID ULTRASOUND TECHNIQUE: Ultrasound examination of the thyroid gland and adjacent soft tissues was performed. COMPARISON:  None. FINDINGS: Parenchymal Echotexture: Mildly heterogenous Isthmus: 0.2 cm Right lobe: 4.1 cm x 1.0 cm x 1.4 cm Left lobe: 6.3 cm x 2.8 cm x 1.2 cm _________________________________________________________ Estimated total number of nodules >/= 1 cm: 1 Number of spongiform nodules >/=  2 cm not described below (TR1): 0 Number of mixed cystic and solid nodules >/= 1.5 cm not described below (TR2): 0  _________________________________________________________ Nodule # 1: Location: Left; Inferior Maximum size: 2.9 cm; Other 2 dimensions: 2.8 cm x 2.7 cm Composition: cystic/almost completely cystic (0) Echogenicity: anechoic (0) Shape: not taller-than-wide (0) Margins: smooth (0) Echogenic foci: none (0) ACR TI-RADS total points: 0. ACR TI-RADS risk category: TR1 (0-1 points). ACR TI-RADS recommendations: Cystic nodule does not meet criteria for surveillance or biopsy _________________________________________________________ No adenopathy IMPRESSION: Left inferior cystic nodule does not meet criteria for biopsy or surveillance, as designated by the newly established ACR TI-RADS criteria. Recommendations follow those established by the new ACR TI-RADS criteria (J Am Coll Radiol 7829;56:213-086). Electronically Signed   By: Corrie Mckusick D.O.   On: 01/10/2019 14:13   CLINICAL DATA:  Left-sided neck and shoulder pain since 09/2018 with weakness and numbness in the left hand.  EXAM: MRI CERVICAL SPINE WITHOUT CONTRAST  TECHNIQUE: Multiplanar, multisequence MR imaging of the cervical spine was performed. No intravenous contrast was administered.  COMPARISON:  Service spine radiographs 10/14/2018  FINDINGS: Alignment: Cervical spine straightening.  No significant listhesis.  Vertebrae: No fracture, suspicious osseous lesion, or significant marrow edema.  Cord: Normal signal.  Posterior Fossa, vertebral arteries, paraspinal tissues: Preserved vertebral artery flow voids. 3.2 cm T2 hyperintense nodule in the left thyroid lobe.  Disc levels:  C2-3: Severe right facet arthrosis and mild uncovertebral spurring result in mild right neural foraminal stenosis without spinal stenosis.  C3-4: Right uncovertebral spurring and mild facet arthrosis result in mild-to-moderate right neural foraminal stenosis without spinal stenosis.  C4-5: Disc bulging and right uncovertebral spurring  result in moderate right neural foraminal stenosis without spinal stenosis.  C5-6: Disc bulging greater to the left and uncovertebral spurring result in mild right and moderate to severe left neural foraminal stenosis with potential left C6 nerve root impingement. There is partial effacement of the ventral thecal sac without significant spinal stenosis or spinal cord mass effect.  C6-7: Mild disc space narrowing. Disc bulging and uncovertebral spurring result in mild bilateral neural foraminal stenosis as well as partial effacement of the ventral thecal sac without significant spinal stenosis or spinal cord mass effect.  C7-T1: Small left paracentral disc protrusion and mild facet arthrosis without stenosis.  IMPRESSION: 1. Cervical disc degeneration most notable at C5-6 where there is moderate to severe left neural foraminal stenosis. 2. Mild bilateral neural foraminal stenosis at C6-7. 3. Mild-to-moderate right neural foraminal stenosis in the upper cervical spine. 4. No significant spinal stenosis. 5. 3.2 cm left thyroid nodule. Thyroid ultrasound is recommended for further evaluation.   Electronically Signed   By: Logan Bores M.D.   On: 12/05/2018 21:15 CLINICAL DATA:  Neck and left arm pain for approximately 3 weeks.  EXAM: CERVICAL SPINE - COMPLETE 4+ VIEW  COMPARISON:  None.  FINDINGS: Alignment is maintained with straightening of lordosis noted.  There is some loss of disc space height and endplate spurring at R6-0 and C6-7, worse at C6-7. Scattered facet arthropathy is most notable at C7-T1 prevertebral soft tissues appear normal. Lung apices are clear.  IMPRESSION: C5-6 and C6-7 degenerative disc disease.   Electronically Signed   By: Inge Rise M.D.   On: 10/14/2018 15:21 PMFS History: Patient Active Problem List   Diagnosis Date Noted  . 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  . Other intervertebral disc  degeneration, lumbar region 01/10/2019  . Vitamin D deficiency 01/03/2016  . ADD (attention deficit disorder) 12/25/2013   Past Medical History:  Diagnosis Date  . ADD (attention deficit disorder)   . GERD (gastroesophageal reflux disease)   . Wears glasses    reading    Family History  Problem Relation Age of Onset  . Heart disease Other     Past Surgical History:  Procedure Laterality Date  . ABDOMINAL HYSTERECTOMY  2006  . BREAST EXCISIONAL BIOPSY Right   . BREAST SURGERY     right br bx x2  . LIGAMENT REPAIR Right 01/03/2014   Procedure: RIGHT LONG REPAIR VS RECONSTRUCTION OF RADIAL COLLATERAL LIGAMENT ;  Surgeon: Schuyler Amor, MD;  Location: [facility];  Service: Orthopedics;  Laterality: Right;   Social History   Occupational History  . Not on file  Tobacco Use  . Smoking status: Never Smoker  . Smokeless tobacco: Never Used  Substance and Sexual Activity  . Alcohol use: Yes    Alcohol/week: 0.0 standard drinks    Comment: Wine twice a week  . Drug use: No  . Sexual activity: Not on file

## 2019-01-12 ENCOUNTER — Encounter: Payer: Self-pay | Admitting: Orthopaedic Surgery

## 2019-02-06 ENCOUNTER — Telehealth: Payer: Self-pay | Admitting: Family Medicine

## 2019-02-06 DIAGNOSIS — F988 Other specified behavioral and emotional disorders with onset usually occurring in childhood and adolescence: Secondary | ICD-10-CM

## 2019-02-06 MED ORDER — AMPHETAMINE-DEXTROAMPHETAMINE 30 MG PO TABS: 30.0000 mg | ORAL_TABLET | Freq: Two times a day (BID) | ORAL | 0 refills | Status: DC

## 2019-02-06 NOTE — Telephone Encounter (Signed)
Patient needs refill on adderall  [facility]  [phone]

## 2019-02-06 NOTE — Telephone Encounter (Signed)
Pt requesting refill on Adderall      LOV: 11/29/18  LRF:   12/19/18

## 2019-03-24 ENCOUNTER — Other Ambulatory Visit: Payer: Self-pay

## 2019-03-24 DIAGNOSIS — F988 Other specified behavioral and emotional disorders with onset usually occurring in childhood and adolescence: Secondary | ICD-10-CM

## 2019-03-24 NOTE — Telephone Encounter (Signed)
Requested Prescriptions   Pending Prescriptions Disp Refills  . amphetamine-dextroamphetamine (ADDERALL) 30 MG tablet 60 tablet 0    Sig: Take 1 tablet by mouth 2 (two) times daily.     Last OV 11/29/2018  Last written 02/06/2019

## 2019-03-27 MED ORDER — AMPHETAMINE-DEXTROAMPHETAMINE 30 MG PO TABS: 30.0000 mg | ORAL_TABLET | Freq: Two times a day (BID) | ORAL | 0 refills | Status: DC

## 2019-05-11 ENCOUNTER — Other Ambulatory Visit: Payer: Self-pay

## 2019-05-11 DIAGNOSIS — F988 Other specified behavioral and emotional disorders with onset usually occurring in childhood and adolescence: Secondary | ICD-10-CM

## 2019-05-11 MED ORDER — AMPHETAMINE-DEXTROAMPHETAMINE 30 MG PO TABS: 30.0000 mg | ORAL_TABLET | Freq: Two times a day (BID) | ORAL | 0 refills | Status: DC

## 2019-05-11 NOTE — Telephone Encounter (Signed)
Requested Prescriptions   Pending Prescriptions Disp Refills  . amphetamine-dextroamphetamine (ADDERALL) 30 MG tablet 60 tablet 0    Sig: Take 1 tablet by mouth 2 (two) times daily.    Last OV 02/06/2019  Last written 03/27/2019

## 2019-06-16 ENCOUNTER — Other Ambulatory Visit: Payer: Self-pay

## 2019-06-16 DIAGNOSIS — F988 Other specified behavioral and emotional disorders with onset usually occurring in childhood and adolescence: Secondary | ICD-10-CM

## 2019-06-16 MED ORDER — AMPHETAMINE-DEXTROAMPHETAMINE 30 MG PO TABS: 30.0000 mg | ORAL_TABLET | Freq: Two times a day (BID) | ORAL | 0 refills | Status: DC

## 2019-06-16 NOTE — Telephone Encounter (Signed)
Requested Prescriptions   Pending Prescriptions Disp Refills  . amphetamine-dextroamphetamine (ADDERALL) 30 MG tablet 60 tablet 0    Sig: Take 1 tablet by mouth 2 (two) times daily.    Last OV 11/29/2018   Last written 05/11/2019

## 2019-08-08 ENCOUNTER — Other Ambulatory Visit: Payer: Self-pay | Admitting: Family Medicine

## 2019-08-08 DIAGNOSIS — F988 Other specified behavioral and emotional disorders with onset usually occurring in childhood and adolescence: Secondary | ICD-10-CM

## 2019-08-08 MED ORDER — AMPHETAMINE-DEXTROAMPHETAMINE 30 MG PO TABS: 30.0000 mg | ORAL_TABLET | Freq: Two times a day (BID) | ORAL | 0 refills | Status: DC

## 2019-08-08 NOTE — Telephone Encounter (Signed)
Pt requesting refill on Adderall      LOV: 11/29/2018  LRF:   06/16/19

## 2019-08-08 NOTE — Telephone Encounter (Signed)
Patient requesting refill on adderall  [facility]

## 2019-09-12 ENCOUNTER — Other Ambulatory Visit: Payer: Self-pay

## 2019-09-12 ENCOUNTER — Telehealth: Payer: Self-pay | Admitting: Family Medicine

## 2019-09-12 DIAGNOSIS — F988 Other specified behavioral and emotional disorders with onset usually occurring in childhood and adolescence: Secondary | ICD-10-CM

## 2019-09-12 MED ORDER — AMPHETAMINE-DEXTROAMPHETAMINE 30 MG PO TABS: 30.0000 mg | ORAL_TABLET | Freq: Two times a day (BID) | ORAL | 0 refills | Status: DC

## 2019-09-12 NOTE — Telephone Encounter (Signed)
Requested Prescriptions   Pending Prescriptions Disp Refills  . amphetamine-dextroamphetamine (ADDERALL) 30 MG tablet 60 tablet 0    Sig: Take 1 tablet by mouth 2 (two) times daily.    Last OV 11/29/2018   Last written 08/08/2019

## 2019-09-12 NOTE — Telephone Encounter (Signed)
Patient requesting refill on adderall [facility]

## 2019-11-02 ENCOUNTER — Other Ambulatory Visit: Payer: Self-pay

## 2019-11-02 ENCOUNTER — Telehealth: Payer: Self-pay | Admitting: Family Medicine

## 2019-11-02 DIAGNOSIS — F988 Other specified behavioral and emotional disorders with onset usually occurring in childhood and adolescence: Secondary | ICD-10-CM

## 2019-11-02 NOTE — Telephone Encounter (Signed)
Requested Prescriptions   Pending Prescriptions Disp Refills  . amphetamine-dextroamphetamine (ADDERALL) 30 MG tablet 60 tablet 0    Sig: Take 1 tablet by mouth 2 (two) times daily.     Last OV 09/27/2018   Last written 09/12/2019

## 2019-11-02 NOTE — Telephone Encounter (Signed)
Patient needs refill on adderal    [facility]

## 2019-11-03 MED ORDER — AMPHETAMINE-DEXTROAMPHETAMINE 30 MG PO TABS: 30.0000 mg | ORAL_TABLET | Freq: Two times a day (BID) | ORAL | 0 refills | Status: DC

## 2019-12-14 ENCOUNTER — Telehealth: Payer: Self-pay | Admitting: Family Medicine

## 2019-12-14 ENCOUNTER — Other Ambulatory Visit: Payer: Self-pay

## 2019-12-14 DIAGNOSIS — F988 Other specified behavioral and emotional disorders with onset usually occurring in childhood and adolescence: Secondary | ICD-10-CM

## 2019-12-14 NOTE — Telephone Encounter (Signed)
CB# [phone] Refill Adderall sent to [facility] [address]

## 2019-12-15 MED ORDER — AMPHETAMINE-DEXTROAMPHETAMINE 30 MG PO TABS: 30.0000 mg | ORAL_TABLET | Freq: Two times a day (BID) | ORAL | 0 refills | Status: DC

## 2019-12-15 NOTE — Telephone Encounter (Signed)
Refilled by K. Buelah Manis, MD

## 2019-12-18 MED ORDER — AMPHETAMINE-DEXTROAMPHETAMINE 30 MG PO TABS: 30.0000 mg | ORAL_TABLET | Freq: Two times a day (BID) | ORAL | 0 refills | Status: DC

## 2020-01-31 ENCOUNTER — Telehealth: Payer: Self-pay | Admitting: Family Medicine

## 2020-01-31 NOTE — Telephone Encounter (Signed)
CB# [phone] Adderall refill

## 2020-02-01 ENCOUNTER — Other Ambulatory Visit: Payer: Self-pay

## 2020-02-01 DIAGNOSIS — F988 Other specified behavioral and emotional disorders with onset usually occurring in childhood and adolescence: Secondary | ICD-10-CM

## 2020-02-01 MED ORDER — AMPHETAMINE-DEXTROAMPHETAMINE 30 MG PO TABS: 30.0000 mg | ORAL_TABLET | Freq: Two times a day (BID) | ORAL | 0 refills | Status: DC

## 2020-02-01 NOTE — Telephone Encounter (Signed)
Last OV 11/29/18 Last refill 12/18/19

## 2020-03-13 ENCOUNTER — Other Ambulatory Visit: Payer: Self-pay | Admitting: Family Medicine

## 2020-03-13 DIAGNOSIS — F988 Other specified behavioral and emotional disorders with onset usually occurring in childhood and adolescence: Secondary | ICD-10-CM

## 2020-03-13 NOTE — Telephone Encounter (Signed)
CB# [phone] Refill Adderall

## 2020-03-14 MED ORDER — AMPHETAMINE-DEXTROAMPHETAMINE 30 MG PO TABS: 30.0000 mg | ORAL_TABLET | Freq: Two times a day (BID) | ORAL | 0 refills | Status: DC

## 2020-03-14 NOTE — Telephone Encounter (Signed)
Ok to refill??  Last office visit 11/29/2018.  Last refill 02/01/2020.

## 2020-04-26 ENCOUNTER — Telehealth: Payer: Self-pay | Admitting: Family Medicine

## 2020-04-26 ENCOUNTER — Other Ambulatory Visit: Payer: Self-pay | Admitting: Family Medicine

## 2020-04-26 DIAGNOSIS — F988 Other specified behavioral and emotional disorders with onset usually occurring in childhood and adolescence: Secondary | ICD-10-CM

## 2020-04-26 MED ORDER — AMPHETAMINE-DEXTROAMPHETAMINE 30 MG PO TABS: 30.0000 mg | ORAL_TABLET | Freq: Two times a day (BID) | ORAL | 0 refills | Status: DC

## 2020-04-26 NOTE — Telephone Encounter (Signed)
Done

## 2020-04-26 NOTE — Telephone Encounter (Signed)
Refill Adderall  [id number] [place], [place] - [address]

## 2020-06-10 ENCOUNTER — Other Ambulatory Visit: Payer: Self-pay | Admitting: Family Medicine

## 2020-06-10 DIAGNOSIS — F988 Other specified behavioral and emotional disorders with onset usually occurring in childhood and adolescence: Secondary | ICD-10-CM

## 2020-06-10 MED ORDER — AMPHETAMINE-DEXTROAMPHETAMINE 30 MG PO TABS: 30.0000 mg | ORAL_TABLET | Freq: Two times a day (BID) | ORAL | 0 refills | Status: DC

## 2020-06-10 NOTE — Telephone Encounter (Signed)
Refill Adderall      [id number] [place], [place] - [address]

## 2020-06-10 NOTE — Telephone Encounter (Signed)
Ok to refill??  Last office visit 11/29/2018.  Last refill 04/26/2020.  Letter sent to patient to schedule F/U.

## 2020-07-23 ENCOUNTER — Telehealth: Payer: Self-pay | Admitting: Family Medicine

## 2020-07-23 NOTE — Telephone Encounter (Signed)
Patient left vm requesting refill on her amphetamine-dextroamphetamine (ADDERALL) 30 MG tablet   CB# [phone]

## 2020-07-24 ENCOUNTER — Other Ambulatory Visit: Payer: Self-pay

## 2020-07-24 DIAGNOSIS — F988 Other specified behavioral and emotional disorders with onset usually occurring in childhood and adolescence: Secondary | ICD-10-CM

## 2020-07-24 NOTE — Telephone Encounter (Signed)
Refill sent to provider

## 2020-07-25 MED ORDER — AMPHETAMINE-DEXTROAMPHETAMINE 30 MG PO TABS: 30.0000 mg | ORAL_TABLET | Freq: Two times a day (BID) | ORAL | 0 refills | Status: DC

## 2020-07-29 ENCOUNTER — Ambulatory Visit: Payer: 59 | Admitting: Family Medicine

## 2020-07-29 ENCOUNTER — Encounter: Payer: Self-pay | Admitting: Family Medicine

## 2020-07-29 ENCOUNTER — Other Ambulatory Visit: Payer: Self-pay

## 2020-07-29 VITALS — BP 132/78 | Temp 97.6°F | Ht 69.0 in | Wt 164.0 lb

## 2020-07-29 DIAGNOSIS — K219 Gastro-esophageal reflux disease without esophagitis: Secondary | ICD-10-CM | POA: Diagnosis not present

## 2020-07-29 DIAGNOSIS — R1319 Other dysphagia: Secondary | ICD-10-CM

## 2020-07-29 MED ORDER — PANTOPRAZOLE SODIUM 40 MG PO TBEC: 40.0000 mg | DELAYED_RELEASE_TABLET | Freq: Every day | ORAL | 1 refills | Status: DC

## 2020-07-29 NOTE — Progress Notes (Signed)
Subjective:    Patient ID: Kathryn Murray, female    DOB: [birth year], 60 y.o.   MRN: [id number]  HPI  Patient states that she has been dealing with acid reflux for several months.  However over the last 2 to 3 weeks she has developed dysphagia.  She states that she is having a difficult time swallowing solid food.  The first bite seems to get "stuck" in her throat.  She points to her mid sternum when she says her throat.  She denies any dysphagia to liquids or soft foods.  However beef and chicken are having a hard time going down.  She denies any melena or hematochezia or weight loss or fevers or chills or chest pain.  She is having wet burps.  She also reports a dry cough especially after eating.  She also reports indigestion on a daily basis  Past Medical History:  Diagnosis Date  . ADD (attention deficit disorder)   . GERD (gastroesophageal reflux disease)   . Wears glasses    reading   Past Surgical History:  Procedure Laterality Date  . ABDOMINAL HYSTERECTOMY  2006  . BREAST EXCISIONAL BIOPSY Right   . BREAST SURGERY     right br bx x2  . LIGAMENT REPAIR Right 01/03/2014   Procedure: RIGHT LONG REPAIR VS RECONSTRUCTION OF RADIAL COLLATERAL LIGAMENT ;  Surgeon: Schuyler Amor, MD;  Location: [facility];  Service: Orthopedics;  Laterality: Right;    Allergies  Allergen Reactions  . Sulphadimidine [Sulfamethazine] Rash   Social History   Socioeconomic History  . Marital status: Married    Spouse name: Not on file  . Number of children: Not on file  . Years of education: Not on file  . Highest education level: Not on file  Occupational History  . Not on file  Tobacco Use  . Smoking status: Never Smoker  . Smokeless tobacco: Never Used  Substance and Sexual Activity  . Alcohol use: Yes    Alcohol/week: 0.0 standard drinks    Comment: Wine twice a week  . Drug use: No  . Sexual activity: Not on file  Other Topics Concern  . Not on file  Social  History Narrative  . Not on file   Social Determinants of Health   Financial Resource Strain: Not on file  Food Insecurity: Not on file  Transportation Needs: Not on file  Physical Activity: Not on file  Stress: Not on file  Social Connections: Not on file  Intimate Partner Violence: Not on file      Review of Systems  All other systems reviewed and are negative.      Objective:   Physical Exam Vitals reviewed.  Constitutional:      Appearance: Normal appearance. She is not ill-appearing, toxic-appearing or diaphoretic.  HENT:     Mouth/Throat:     Mouth: Mucous membranes are moist.     Pharynx: Oropharynx is clear. No oropharyngeal exudate or posterior oropharyngeal erythema.  Cardiovascular:     Rate and Rhythm: Normal rate and regular rhythm.     Pulses: Normal pulses.     Heart sounds: Normal heart sounds. No murmur heard.   Pulmonary:     Effort: Pulmonary effort is normal.     Breath sounds: Normal breath sounds. No wheezing, rhonchi or rales.  Abdominal:     General: Abdomen is flat. Bowel sounds are normal. There is no distension.     Palpations: Abdomen is soft.  Musculoskeletal:  Cervical back: Neck supple. No rigidity or tenderness.  Lymphadenopathy:     Cervical: No cervical adenopathy.  Neurological:     General: No focal deficit present.     Mental Status: She is alert and oriented to person, place, and time. Mental status is at baseline.     Cranial Nerves: No cranial nerve deficit.     Sensory: No sensory deficit.     Motor: No weakness.     Deep Tendon Reflexes: Reflexes normal.           Assessment & Plan:  Esophageal dysphagia  Gastroesophageal reflux disease, unspecified whether esophagitis present  I believe the patient's acid reflux is contributed to swelling in the esophagus causing dysphagia.  Try Protonix 40 mg daily with food.  Reassess in 3 to 4 weeks.  If no better at that time, I would consult GI for an EGD.  Recheck  sooner if symptoms worsen or if she develops hematemesis or melena or hematochezia or weight loss or fevers

## 2020-08-20 ENCOUNTER — Encounter: Payer: Self-pay | Admitting: Family Medicine

## 2020-08-21 ENCOUNTER — Other Ambulatory Visit: Payer: Self-pay | Admitting: Family Medicine

## 2020-08-21 ENCOUNTER — Telehealth: Payer: Self-pay

## 2020-08-21 ENCOUNTER — Other Ambulatory Visit: Payer: Self-pay

## 2020-08-21 ENCOUNTER — Ambulatory Visit
Admission: RE | Admit: 2020-08-21 | Discharge: 2020-08-21 | Disposition: A | Discharge status: Home / Self Care | Payer: 59 | Source: Ambulatory Visit | Attending: Emergency Medicine | Admitting: Emergency Medicine

## 2020-08-21 VITALS — BP 145/77 | HR 105 | Temp 98.2°F | Resp 18

## 2020-08-21 DIAGNOSIS — J209 Acute bronchitis, unspecified: Secondary | ICD-10-CM

## 2020-08-21 DIAGNOSIS — R059 Cough, unspecified: Secondary | ICD-10-CM

## 2020-08-21 DIAGNOSIS — Z1152 Encounter for screening for COVID-19: Secondary | ICD-10-CM | POA: Diagnosis not present

## 2020-08-21 MED ORDER — AZITHROMYCIN 250 MG PO TABS: 250.0000 mg | ORAL_TABLET | Freq: Every day | ORAL | 0 refills | Status: DC

## 2020-08-21 MED ORDER — PREDNISONE 10 MG PO TABS: 20.0000 mg | ORAL_TABLET | Freq: Every day | ORAL | 0 refills | Status: DC

## 2020-08-21 MED ORDER — BENZONATATE 100 MG PO CAPS: 100.0000 mg | ORAL_CAPSULE | Freq: Three times a day (TID) | ORAL | 0 refills | Status: DC | PRN

## 2020-08-21 MED ORDER — ALBUTEROL SULFATE HFA 108 (90 BASE) MCG/ACT IN AERS: 1.0000 | INHALATION_SPRAY | Freq: Four times a day (QID) | RESPIRATORY_TRACT | 0 refills | Status: DC | PRN

## 2020-08-21 NOTE — Discharge Instructions (Signed)
COVID testing ordered.  It will take between 2-7 days for test results.  Someone will contact you regarding abnormal results.    Get plenty of rest and push fluids Tessalon Perles prescribed for cough Prednisone was prescribed ProAir was prescribed Azithromycin was prescribed for possible bronchitis Use medications daily for symptom relief Use OTC medications like ibuprofen or tylenol as needed fever or pain Call or go to the ED if you have any new or worsening symptoms such as fever, worsening cough, shortness of breath, chest tightness, chest pain, turning blue, changes in mental status, etc..Marland Kitchen

## 2020-08-21 NOTE — ED Triage Notes (Signed)
Cough x 3 weeks was put on medication for indigestion to see if it would help with cough,  No change.  Had a wheezing episode with coughing on Monday.  Coughing on exercertion today.

## 2020-08-21 NOTE — Telephone Encounter (Signed)
Pt called to ask for medication for possible asthma attack. Says she has never been dx with this. Pt told she will have to be seen by doctor in order for med to be given. Pt says attack has happened at least 3 times, last attack was this past Monday. Pt was told to go to ER or UC if she feels she is having another attack/breathing issue. Given first available appt 08/26/20 1215pm

## 2020-08-21 NOTE — ED Provider Notes (Signed)
[facility]   [id number] 08/21/20 Arrival Time: 7858   CC: Cough  SUBJECTIVE: History from: patient.  Kathryn Murray is a 60 y.o. female who presents w to the urgent care with a complaint of cough for the past 3 weeks.  Has seen PCP and was put on acid reflux medication.  Reports symptom has not improved and got worse the past few days with wheezing.  Denies sick exposure to COVID, flu or strep.  Denies recent travel.  Denies alleviating or aggravating factors.  Denies previous symptoms in the past.   Denies fever, chills, fatigue, sinus pain, rhinorrhea, sore throat, SOB, wheezing, chest pain, nausea, changes in bowel or bladder habits.   Need to rule out  ROS: As per HPI.  All other pertinent ROS negative.     Past Medical History:  Diagnosis Date  . ADD (attention deficit disorder)   . GERD (gastroesophageal reflux disease)   . Wears glasses    reading   Past Surgical History:  Procedure Laterality Date  . ABDOMINAL HYSTERECTOMY  2006  . BREAST EXCISIONAL BIOPSY Right   . BREAST SURGERY     right br bx x2  . LIGAMENT REPAIR Right 01/03/2014   Procedure: RIGHT LONG REPAIR VS RECONSTRUCTION OF RADIAL COLLATERAL LIGAMENT ;  Surgeon: Schuyler Amor, MD;  Location: [facility];  Service: Orthopedics;  Laterality: Right;   Allergies  Allergen Reactions  . Sulphadimidine [Sulfamethazine] Rash   No current facility-administered medications on file prior to encounter.   Current Outpatient Medications on File Prior to Encounter  Medication Sig Dispense Refill  . amphetamine-dextroamphetamine (ADDERALL) 30 MG tablet Take 1 tablet by mouth 2 (two) times daily. 60 tablet 0  . Cholecalciferol (VITAMIN D3) 2000 units TABS Take 1 tablet by mouth daily.    . cyclobenzaprine (FLEXERIL) 10 MG tablet Take 1 tablet (10 mg total) by mouth 3 (three) times daily as needed for muscle spasms. (Patient not taking: Reported on 07/29/2020) 20 tablet 0  . estradiol  (VIVELLE-DOT) 0.05 MG/24HR patch APPLY 1 PATCH TWICE WEEKLY AS DIRECTED. (Patient not taking: Reported on 07/29/2020)  12  . naproxen (NAPROSYN) 500 MG tablet Take 1 tablet (500 mg total) by mouth 2 (two) times daily with a meal. (Patient not taking: Reported on 07/29/2020) 60 tablet 1  . oxyCODONE-acetaminophen (PERCOCET) 5-325 MG tablet Take 1 tablet by mouth every 6 (six) hours as needed for severe pain. (Patient not taking: Reported on 07/29/2020) 30 tablet 0  . pantoprazole (PROTONIX) 40 MG tablet TAKE 1 TABLET BY MOUTH EVERY DAY 30 tablet 1  . zolpidem (AMBIEN) 5 MG tablet Take 5 mg by mouth at bedtime as needed.  5   Social History   Socioeconomic History  . Marital status: Married    Spouse name: Not on file  . Number of children: Not on file  . Years of education: Not on file  . Highest education level: Not on file  Occupational History  . Not on file  Tobacco Use  . Smoking status: Never Smoker  . Smokeless tobacco: Never Used  Substance and Sexual Activity  . Alcohol use: Yes    Alcohol/week: 0.0 standard drinks    Comment: Wine twice a week  . Drug use: No  . Sexual activity: Not on file  Other Topics Concern  . Not on file  Social History Narrative  . Not on file   Social Determinants of Health   Financial Resource Strain: Not on file  Food Insecurity: Not on file  Transportation Needs: Not on file  Physical Activity: Not on file  Stress: Not on file  Social Connections: Not on file  Intimate Partner Violence: Not on file   Family History  Problem Relation Age of Onset  . Heart disease Other     OBJECTIVE:  Vitals:   08/21/20 1507  BP: (!) 145/77  Pulse: (!) 105  Resp: 18  Temp: 98.2 F (36.8 C)  TempSrc: Oral  SpO2: 99%     General appearance: alert; appears fatigued, but nontoxic; speaking in full sentences and tolerating own secretions HEENT: NCAT; Ears: EACs clear, TMs pearly gray; Eyes: PERRL.  EOM grossly intact. Sinuses: nontender; Nose:  nares patent without rhinorrhea, Throat: oropharynx clear, tonsils non erythematous or enlarged, uvula midline  Neck: supple without LAD Lungs: unlabored respirations, symmetrical air entry; cough: moderate; no respiratory distress; CTAB Heart: regular rate and rhythm.  Radial pulses 2+ symmetrical bilaterally Skin: warm and dry Psychological: alert and cooperative; normal mood and affect  LABS:  No results found for this or any previous visit (from the past 24 hour(s)).   ASSESSMENT & PLAN:  1. Cough   2. Acute bronchitis, unspecified organism   3. Encounter for screening for COVID-19     Meds ordered this encounter  Medications  . benzonatate (TESSALON) 100 MG capsule    Sig: Take 1 capsule (100 mg total) by mouth 3 (three) times daily as needed for cough.    Dispense:  30 capsule    Refill:  0  . predniSONE (DELTASONE) 10 MG tablet    Sig: Take 2 tablets (20 mg total) by mouth daily.    Dispense:  15 tablet    Refill:  0  . albuterol (VENTOLIN HFA) 108 (90 Base) MCG/ACT inhaler    Sig: Inhale 1-2 puffs into the lungs every 6 (six) hours as needed for wheezing or shortness of breath.    Dispense:  18 g    Refill:  0  . azithromycin (ZITHROMAX) 250 MG tablet    Sig: Take 1 tablet (250 mg total) by mouth daily. Take first 2 tablets together, then 1 every day until finished.    Dispense:  6 tablet    Refill:  0   COVID-19 test will be completed for rule out.   Discharge Instructions  COVID testing ordered.  It will take between 2-7 days for test results.  Someone will contact you regarding abnormal results.    Get plenty of rest and push fluids Tessalon Perles prescribed for cough Prednisone was prescribed ProAir was prescribed Azithromycin was prescribed for possible bronchitis Use medications daily for symptom relief Use OTC medications like ibuprofen or tylenol as needed fever or pain Call or go to the ED if you have any new or worsening symptoms such as fever,  worsening cough, shortness of breath, chest tightness, chest pain, turning blue, changes in mental status, etc...   Reviewed expectations re: course of current medical issues. Questions answered. Outlined signs and symptoms indicating need for more acute intervention. Patient verbalized understanding. After Visit Summary given.         Emerson Monte, [place] 08/21/20 1539

## 2020-08-22 LAB — NOVEL CORONAVIRUS, NAA: SARS-CoV-2, NAA: NOT DETECTED

## 2020-08-22 LAB — SARS-COV-2, NAA 2 DAY TAT

## 2020-08-26 ENCOUNTER — Ambulatory Visit: Payer: Self-pay | Admitting: Family Medicine

## 2020-09-05 ENCOUNTER — Other Ambulatory Visit: Payer: Self-pay

## 2020-09-05 ENCOUNTER — Ambulatory Visit
Admission: RE | Admit: 2020-09-05 | Discharge: 2020-09-05 | Disposition: A | Discharge status: Home / Self Care | Payer: 59 | Source: Ambulatory Visit | Attending: Emergency Medicine | Admitting: Emergency Medicine

## 2020-09-05 ENCOUNTER — Ambulatory Visit (INDEPENDENT_AMBULATORY_CARE_PROVIDER_SITE_OTHER): Payer: 59

## 2020-09-05 ENCOUNTER — Other Ambulatory Visit: Payer: Self-pay | Admitting: Family Medicine

## 2020-09-05 VITALS — BP 153/82 | HR 88 | Temp 98.1°F | Resp 18

## 2020-09-05 DIAGNOSIS — R0602 Shortness of breath: Secondary | ICD-10-CM

## 2020-09-05 DIAGNOSIS — R059 Cough, unspecified: Secondary | ICD-10-CM

## 2020-09-05 DIAGNOSIS — F988 Other specified behavioral and emotional disorders with onset usually occurring in childhood and adolescence: Secondary | ICD-10-CM

## 2020-09-05 MED ORDER — PREDNISONE 10 MG PO TABS: 20.0000 mg | ORAL_TABLET | Freq: Every day | ORAL | 0 refills | Status: DC

## 2020-09-05 MED ORDER — GUAIFENESIN-CODEINE 100-10 MG/5ML PO SOLN: 5.0000 mL | Freq: Three times a day (TID) | ORAL | 0 refills | Status: DC | PRN

## 2020-09-05 NOTE — ED Provider Notes (Addendum)
[facility]   [id number] 09/05/20 Arrival Time: 0623   CC: Worsening Cough   SUBJECTIVE: History from: patient.  Kathryn Murray is a 60 y.o. female who presented to the urgent care for complaint of cough and wheezing and  SOB for the past 2 weeks.  Was seen here at the urgent care on 08/21/2020 and was prescribed azithromycin, prednisone, Tessalon Perles.  She states symptom has improved mildly and then worsened.  Denies known sick exposure to COVID, flu or strep.  Denies recent travel.  Denies alleviating or aggravating factors.  Denies previous symptoms in the past.   Denies fever, chills, fatigue, sinus pain, rhinorrhea, sore throat,  wheezing, chest pain, nausea, changes in bowel or bladder habits.    ROS: As per HPI.  All other pertinent ROS negative.      Past Medical History:  Diagnosis Date  . ADD (attention deficit disorder)   . GERD (gastroesophageal reflux disease)   . Wears glasses    reading   Past Surgical History:  Procedure Laterality Date  . ABDOMINAL HYSTERECTOMY  2006  . BREAST EXCISIONAL BIOPSY Right   . BREAST SURGERY     right br bx x2  . LIGAMENT REPAIR Right 01/03/2014   Procedure: RIGHT LONG REPAIR VS RECONSTRUCTION OF RADIAL COLLATERAL LIGAMENT ;  Surgeon: Schuyler Amor, MD;  Location: [facility];  Service: Orthopedics;  Laterality: Right;   Allergies  Allergen Reactions  . Sulphadimidine [Sulfamethazine] Rash   No current facility-administered medications on file prior to encounter.   Current Outpatient Medications on File Prior to Encounter  Medication Sig Dispense Refill  . albuterol (VENTOLIN HFA) 108 (90 Base) MCG/ACT inhaler Inhale 1-2 puffs into the lungs every 6 (six) hours as needed for wheezing or shortness of breath. 18 g 0  . amphetamine-dextroamphetamine (ADDERALL) 30 MG tablet Take 1 tablet by mouth 2 (two) times daily. 60 tablet 0  . azithromycin (ZITHROMAX) 250 MG tablet Take 1 tablet (250 mg total) by  mouth daily. Take first 2 tablets together, then 1 every day until finished. 6 tablet 0  . benzonatate (TESSALON) 100 MG capsule Take 1 capsule (100 mg total) by mouth 3 (three) times daily as needed for cough. 30 capsule 0  . Cholecalciferol (VITAMIN D3) 2000 units TABS Take 1 tablet by mouth daily.    . cyclobenzaprine (FLEXERIL) 10 MG tablet Take 1 tablet (10 mg total) by mouth 3 (three) times daily as needed for muscle spasms. (Patient not taking: Reported on 07/29/2020) 20 tablet 0  . estradiol (VIVELLE-DOT) 0.05 MG/24HR patch APPLY 1 PATCH TWICE WEEKLY AS DIRECTED. (Patient not taking: Reported on 07/29/2020)  12  . naproxen (NAPROSYN) 500 MG tablet Take 1 tablet (500 mg total) by mouth 2 (two) times daily with a meal. (Patient not taking: Reported on 07/29/2020) 60 tablet 1  . oxyCODONE-acetaminophen (PERCOCET) 5-325 MG tablet Take 1 tablet by mouth every 6 (six) hours as needed for severe pain. (Patient not taking: Reported on 07/29/2020) 30 tablet 0  . pantoprazole (PROTONIX) 40 MG tablet TAKE 1 TABLET BY MOUTH EVERY DAY 30 tablet 1  . zolpidem (AMBIEN) 5 MG tablet Take 5 mg by mouth at bedtime as needed.  5   Social History   Socioeconomic History  . Marital status: Married    Spouse name: Not on file  . Number of children: Not on file  . Years of education: Not on file  . Highest education level: Not on file  Occupational History  . Not on file  Tobacco Use  . Smoking status: Never Smoker  . Smokeless tobacco: Never Used  Substance and Sexual Activity  . Alcohol use: Yes    Alcohol/week: 0.0 standard drinks    Comment: Wine twice a week  . Drug use: No  . Sexual activity: Not on file  Other Topics Concern  . Not on file  Social History Narrative  . Not on file   Social Determinants of Health   Financial Resource Strain: Not on file  Food Insecurity: Not on file  Transportation Needs: Not on file  Physical Activity: Not on file  Stress: Not on file  Social Connections:  Not on file  Intimate Partner Violence: Not on file   Family History  Problem Relation Age of Onset  . Heart disease Other     OBJECTIVE:  Vitals:   09/05/20 1338  BP: (!) 153/82  Pulse: 88  Resp: 18  Temp: 98.1 F (36.7 C)  TempSrc: Oral  SpO2: 97%     General appearance: alert; appears fatigued, but nontoxic; speaking in full sentences and tolerating own secretions HEENT: NCAT; Ears: EACs clear, TMs pearly gray; Eyes: PERRL.  EOM grossly intact. Sinuses: nontender; Nose: nares patent without rhinorrhea, Throat: oropharynx clear, tonsils non erythematous or enlarged, uvula midline  Neck: supple without LAD Lungs: unlabored respirations, symmetrical air entry; cough: moderate; no respiratory distress; CTAB Heart: regular rate and rhythm.  Radial pulses 2+ symmetrical bilaterally Skin: warm and dry Psychological: alert and cooperative; normal mood and affect  LABS:  No results found for this or any previous visit (from the past 24 hour(s)).   RADIOLOGY  DG Chest 2 View  Result Date: 09/05/2020 CLINICAL DATA:  Worsening cough.  Shortness of breath. EXAM: CHEST - 2 VIEW COMPARISON:  Chest x-ray 11/20/2003. FINDINGS: Mediastinum and hilar structures normal. Lungs are clear. No pleural effusion or pneumothorax. Heart size normal. Thoracic spine scoliosis and degenerative change. No acute bony abnormality. IMPRESSION: No acute cardiopulmonary disease. Electronically Signed   By: Marcello Moores  Register   On: 09/05/2020 14:56     Chest X-ray is negative for acute cardiopulmonary abnormality.  I have reviewed the x-ray myself and the radiologist interpretation.  I am in agreement with the radiologist interpretation.    ASSESSMENT & PLAN:  1. Cough   2. SOB (shortness of breath)     Meds ordered this encounter  Medications  . guaiFENesin-codeine 100-10 MG/5ML syrup    Sig: Take 5 mLs by mouth 3 (three) times daily as needed for cough.    Dispense:  120 mL    Refill:  0  .  predniSONE (DELTASONE) 10 MG tablet    Sig: Take 2 tablets (20 mg total) by mouth daily.    Dispense:  15 tablet    Refill:  0    Discharge Instructions   Get plenty of rest and push fluids Guaifenesin-codeine was prescribed for cough Continue to use albuterol as prescribed for wheezing and SOB Prednisone was prescribed/take as directed Use medications daily for symptom relief Use OTC medications like ibuprofen or tylenol as needed fever or pain Call or go to the ED if you have any new or worsening symptoms such as fever, worsening cough, shortness of breath, chest tightness, chest pain, turning blue, changes in mental status, etc...   Reviewed expectations re: course of current medical issues. Questions answered. Outlined signs and symptoms indicating need for more acute intervention. Patient verbalized understanding. After Visit Summary  given.         Emerson Monte, FNP 09/05/20 1514    Emerson Monte, FNP 09/05/20 [phone]

## 2020-09-05 NOTE — Discharge Instructions (Signed)
Get plenty of rest and push fluids Guaifenesin-codeine was prescribed for cough Continue to use albuterol as prescribed for wheezing and SOB Prednisone was prescribed/take as directed Use medications daily for symptom relief Use OTC medications like ibuprofen or tylenol as needed fever or pain Call or go to the ED if you have any new or worsening symptoms such as fever, worsening cough, shortness of breath, chest tightness, chest pain, turning blue, changes in mental status, etc..Marland Kitchen

## 2020-09-05 NOTE — Telephone Encounter (Signed)
Pt called needing a refill of  amphetamine-dextroamphetamine (ADDERALL) 30 MG tablet   Cb#: [phone]

## 2020-09-05 NOTE — ED Triage Notes (Signed)
Cough and wheezing that will not get better. Seen here on 08/21/2020.

## 2020-09-06 MED ORDER — AMPHETAMINE-DEXTROAMPHETAMINE 30 MG PO TABS: 30.0000 mg | ORAL_TABLET | Freq: Two times a day (BID) | ORAL | 0 refills | Status: DC

## 2020-09-14 ENCOUNTER — Other Ambulatory Visit: Payer: Self-pay | Admitting: Family Medicine

## 2020-10-01 ENCOUNTER — Encounter: Payer: Self-pay | Admitting: Family Medicine

## 2020-10-01 ENCOUNTER — Ambulatory Visit (INDEPENDENT_AMBULATORY_CARE_PROVIDER_SITE_OTHER): Payer: 59 | Admitting: Family Medicine

## 2020-10-01 ENCOUNTER — Other Ambulatory Visit: Payer: Self-pay

## 2020-10-01 VITALS — BP 138/74 | HR 120 | Temp 98.7°F | Resp 14 | Ht 69.0 in | Wt 166.0 lb

## 2020-10-01 DIAGNOSIS — J4541 Moderate persistent asthma with (acute) exacerbation: Secondary | ICD-10-CM

## 2020-10-01 MED ORDER — ALBUTEROL SULFATE HFA 108 (90 BASE) MCG/ACT IN AERS: 1.0000 | INHALATION_SPRAY | Freq: Four times a day (QID) | RESPIRATORY_TRACT | 1 refills | Status: DC | PRN

## 2020-10-01 MED ORDER — LEVOCETIRIZINE DIHYDROCHLORIDE 5 MG PO TABS: 5.0000 mg | ORAL_TABLET | Freq: Every evening | ORAL | 3 refills | Status: DC

## 2020-10-01 MED ORDER — BUDESONIDE-FORMOTEROL FUMARATE 160-4.5 MCG/ACT IN AERO: 2.0000 | INHALATION_SPRAY | Freq: Two times a day (BID) | RESPIRATORY_TRACT | 3 refills | Status: DC

## 2020-10-01 NOTE — Progress Notes (Signed)
Subjective:    Patient ID: Kathryn Murray, female    DOB: [birth year], 60 y.o.   MRN: [id number]  HPI  Patient is a 60 year old Caucasian female here today for asthma attacks.  She has no previous history of asthma.  Her sister has had asthma her entire life.  However the patient had never had asthma attacks prior to recently.  She was seen in the emergency room on February 16 with bronchospasms and was given prednisone and albuterol along with a Z-Pak.  This was attributed to bronchitis.  While she was taking prednisone, the cough and wheezing and bronchospasms improved however as soon as she stopped the prednisone, she started to have to use albuterol more frequently.  She went back to the emergency room on March 3.  A chest x-ray was normal however she was wheezing at that time and they put her back on prednisone.  The prednisone helps her symptoms while she is taking it however when she stops the prednisone the attacks become more frequent.  She states that she is have not use the albuterol frequently on a daily basis.  She states that she will suddenly get intact where she will have a difficult time breathing.  She will have a tightness in her chest.  She will have coughing and wheezing.  Since she takes albuterol the tightness and wheezing will improve.  She is also been reporting rhinorrhea and head congestion.  She does have cats that are new that are now living in her home with her.  She may be allergic to the cats.  Otherwise, her husband has been checking their HVAC system.  She does not believe that there is mold under her house or in her crawl space.  The filters have been checked and cleaned regularly.  She keeps her house very clean and there has not been any mold or other potential triggers seen.  She does have seasonal allergies which could be contributing.  Past Medical History:  Diagnosis Date  . ADD (attention deficit disorder)   . GERD (gastroesophageal reflux disease)   . Wears  glasses    reading   Past Surgical History:  Procedure Laterality Date  . ABDOMINAL HYSTERECTOMY  2006  . BREAST EXCISIONAL BIOPSY Right   . BREAST SURGERY     right br bx x2  . LIGAMENT REPAIR Right 01/03/2014   Procedure: RIGHT LONG REPAIR VS RECONSTRUCTION OF RADIAL COLLATERAL LIGAMENT ;  Surgeon: Schuyler Amor, MD;  Location: [facility];  Service: Orthopedics;  Laterality: Right;   Current Outpatient Medications on File Prior to Visit  Medication Sig Dispense Refill  . albuterol (VENTOLIN HFA) 108 (90 Base) MCG/ACT inhaler Inhale 1-2 puffs into the lungs every 6 (six) hours as needed for wheezing or shortness of breath. 18 g 0  . amphetamine-dextroamphetamine (ADDERALL) 30 MG tablet Take 1 tablet by mouth 2 (two) times daily. 60 tablet 0  . Cholecalciferol (VITAMIN D3) 2000 units TABS Take 1 tablet by mouth daily.    . pantoprazole (PROTONIX) 40 MG tablet TAKE 1 TABLET BY MOUTH EVERY DAY 30 tablet 1  . zolpidem (AMBIEN) 5 MG tablet Take 5 mg by mouth at bedtime as needed.  5  . cyclobenzaprine (FLEXERIL) 10 MG tablet Take 1 tablet (10 mg total) by mouth 3 (three) times daily as needed for muscle spasms. (Patient not taking: Reported on 10/01/2020) 20 tablet 0  . estradiol (VIVELLE-DOT) 0.05 MG/24HR patch APPLY 1 PATCH TWICE WEEKLY AS  DIRECTED. (Patient not taking: Reported on 10/01/2020)  12  . naproxen (NAPROSYN) 500 MG tablet Take 1 tablet (500 mg total) by mouth 2 (two) times daily with a meal. (Patient not taking: Reported on 10/01/2020) 60 tablet 1  . oxyCODONE-acetaminophen (PERCOCET) 5-325 MG tablet Take 1 tablet by mouth every 6 (six) hours as needed for severe pain. (Patient not taking: Reported on 10/01/2020) 30 tablet 0   No current facility-administered medications on file prior to visit.    Allergies  Allergen Reactions  . Sulphadimidine [Sulfamethazine] Rash   Social History   Socioeconomic History  . Marital status: Married    Spouse name: Not on  file  . Number of children: Not on file  . Years of education: Not on file  . Highest education level: Not on file  Occupational History  . Not on file  Tobacco Use  . Smoking status: Never Smoker  . Smokeless tobacco: Never Used  Substance and Sexual Activity  . Alcohol use: Yes    Alcohol/week: 0.0 standard drinks    Comment: Wine twice a week  . Drug use: No  . Sexual activity: Not on file  Other Topics Concern  . Not on file  Social History Narrative  . Not on file   Social Determinants of Health   Financial Resource Strain: Not on file  Food Insecurity: Not on file  Transportation Needs: Not on file  Physical Activity: Not on file  Stress: Not on file  Social Connections: Not on file  Intimate Partner Violence: Not on file      Review of Systems  All other systems reviewed and are negative.      Objective:   Physical Exam Vitals reviewed.  Constitutional:      Appearance: Normal appearance. She is not ill-appearing, toxic-appearing or diaphoretic.  HENT:     Right Ear: Tympanic membrane and ear canal normal.     Left Ear: Tympanic membrane and ear canal normal.     Nose: Congestion and rhinorrhea present.     Mouth/Throat:     Mouth: Mucous membranes are moist.     Pharynx: Oropharynx is clear. No oropharyngeal exudate or posterior oropharyngeal erythema.  Cardiovascular:     Rate and Rhythm: Normal rate and regular rhythm.     Pulses: Normal pulses.     Heart sounds: Normal heart sounds. No murmur heard.   Pulmonary:     Effort: Pulmonary effort is normal. No respiratory distress.     Breath sounds: Normal breath sounds. No stridor. No wheezing, rhonchi or rales.  Chest:     Chest wall: No tenderness.  Abdominal:     General: Abdomen is flat. Bowel sounds are normal. There is no distension.     Palpations: Abdomen is soft.  Musculoskeletal:     Cervical back: Neck supple. No rigidity or tenderness.  Lymphadenopathy:     Cervical: No cervical  adenopathy.  Neurological:     General: No focal deficit present.     Mental Status: She is alert and oriented to person, place, and time. Mental status is at baseline.     Cranial Nerves: No cranial nerve deficit.     Sensory: No sensory deficit.     Motor: No weakness.     Deep Tendon Reflexes: Reflexes normal.           Assessment & Plan:  Moderate persistent asthma with acute exacerbation  I believe the patient is having moderate persistent asthma most likely triggered  by some type of allergy.  I I suspect the cats may be playing a role.  I recommended that we start her on Symbicort 160/4.5, 2 puffs inhaled twice daily as a preventative.  We will also add Xyzal 5 mg a day for seasonal allergies that she is having rhinorrhea and head congestion.  I hope that this combination will decrease the frequency of the attacks she is already had to go the emergency room twice within a 1 month frame of time.  She is also having to use her albuterol frequently.  If this controls her asthma, we may consider stepping down on therapy after 2 or 3 months per tickly once allergy season subsides.  Patient is comfortable with this plan

## 2020-10-14 ENCOUNTER — Other Ambulatory Visit: Payer: Self-pay | Admitting: Family Medicine

## 2020-10-14 DIAGNOSIS — F988 Other specified behavioral and emotional disorders with onset usually occurring in childhood and adolescence: Secondary | ICD-10-CM

## 2020-10-14 NOTE — Telephone Encounter (Signed)
Patient called to request refill of   amphetamine-dextroamphetamine (ADDERALL) 30 MG tablet [[id number]]   Pharmacy:   [id number] [place], [place]  [address] [place] [address]  Phone:  [phone] Fax:  [phone]  DEA #:  [id number]  Patient almost out of medication. Please advise when Rx called in at [phone].

## 2020-10-14 NOTE — Telephone Encounter (Signed)
Ok to refill??  Last office visit 10/01/2020.  Last refill 09/06/2020.

## 2020-10-15 MED ORDER — AMPHETAMINE-DEXTROAMPHETAMINE 30 MG PO TABS: 30.0000 mg | ORAL_TABLET | Freq: Two times a day (BID) | ORAL | 0 refills | Status: DC

## 2020-11-22 ENCOUNTER — Other Ambulatory Visit: Payer: Self-pay | Admitting: Family Medicine

## 2020-11-22 DIAGNOSIS — F988 Other specified behavioral and emotional disorders with onset usually occurring in childhood and adolescence: Secondary | ICD-10-CM

## 2020-11-22 MED ORDER — AMPHETAMINE-DEXTROAMPHETAMINE 30 MG PO TABS: 30.0000 mg | ORAL_TABLET | Freq: Two times a day (BID) | ORAL | 0 refills | Status: DC

## 2020-11-22 NOTE — Telephone Encounter (Signed)
Ok to refill??  Last office visit 10/01/2020.  Last refill 10/15/2020.

## 2020-11-22 NOTE — Telephone Encounter (Signed)
Patient called to request refill of amphetamine-dextroamphetamine (ADDERALL) 30 MG tablet [[id number]]   Pharmacy confirmed as  [id number] [place], [place]  [address] [place] [address]  Phone:  [phone] Fax:  [phone]  DEA #:  [id number]  Patient took last pill today.   Please advise patient when Rx called in at [phone].

## 2020-11-26 ENCOUNTER — Other Ambulatory Visit: Payer: Self-pay | Admitting: *Deleted

## 2020-11-26 MED ORDER — PANTOPRAZOLE SODIUM 40 MG PO TBEC: 1.0000 | DELAYED_RELEASE_TABLET | Freq: Every day | ORAL | 1 refills | Status: DC

## 2020-12-28 ENCOUNTER — Other Ambulatory Visit: Payer: Self-pay | Admitting: Family Medicine

## 2021-01-02 ENCOUNTER — Other Ambulatory Visit: Payer: Self-pay | Admitting: Family Medicine

## 2021-01-02 DIAGNOSIS — F988 Other specified behavioral and emotional disorders with onset usually occurring in childhood and adolescence: Secondary | ICD-10-CM

## 2021-01-02 MED ORDER — AMPHETAMINE-DEXTROAMPHETAMINE 30 MG PO TABS: 30.0000 mg | ORAL_TABLET | Freq: Two times a day (BID) | ORAL | 0 refills | Status: DC

## 2021-01-02 NOTE — Telephone Encounter (Signed)
Pt called in requesting a refill of amphetamine-dextroamphetamine (ADDERALL) 30 MG tablet be sent to the pharmacy.   Cb#: [phone]

## 2021-01-02 NOTE — Telephone Encounter (Signed)
PDMP reviewed.  Appears patient takes this medication chronically.  Refill given in place of PCP who is out of the office. 

## 2021-01-02 NOTE — Telephone Encounter (Signed)
Ok to refill??  Last office visit 10/01/2020.  Last refill 11/22/2020.

## 2021-01-28 ENCOUNTER — Other Ambulatory Visit: Payer: Self-pay | Admitting: Family Medicine

## 2021-02-11 ENCOUNTER — Other Ambulatory Visit: Payer: Self-pay | Admitting: Family Medicine

## 2021-02-11 DIAGNOSIS — F988 Other specified behavioral and emotional disorders with onset usually occurring in childhood and adolescence: Secondary | ICD-10-CM

## 2021-02-11 MED ORDER — AMPHETAMINE-DEXTROAMPHETAMINE 30 MG PO TABS: 30.0000 mg | ORAL_TABLET | Freq: Two times a day (BID) | ORAL | 0 refills | Status: DC

## 2021-02-11 NOTE — Telephone Encounter (Signed)
Patient requesting refill on her adderall. [facility] [address]  CB# [phone]

## 2021-02-11 NOTE — Telephone Encounter (Signed)
Ok to refill??  Last office visit  10/01/2020.   Last refill 01/02/2021.

## 2021-03-12 ENCOUNTER — Other Ambulatory Visit: Payer: Self-pay | Admitting: Family Medicine

## 2021-03-18 ENCOUNTER — Other Ambulatory Visit: Payer: Self-pay | Admitting: Family Medicine

## 2021-03-18 DIAGNOSIS — F988 Other specified behavioral and emotional disorders with onset usually occurring in childhood and adolescence: Secondary | ICD-10-CM

## 2021-03-18 NOTE — Telephone Encounter (Signed)
Patient called to request refill of amphetamine-dextroamphetamine (ADDERALL) 30 MG tablet [id number]   Has 2 pills left  [facility]  [id number] [place] [address] Phone:  [phone] Fax:  [phone] DEA #:  [id number] Please advise at [phone]

## 2021-03-18 NOTE — Telephone Encounter (Signed)
Ok to refill??  Last office visit 10/01/2020.   Last refill 02/11/2021.

## 2021-03-19 MED ORDER — AMPHETAMINE-DEXTROAMPHETAMINE 30 MG PO TABS: 30.0000 mg | ORAL_TABLET | Freq: Two times a day (BID) | ORAL | 0 refills | Status: DC

## 2021-04-01 ENCOUNTER — Encounter: Payer: Self-pay | Admitting: Internal Medicine

## 2021-04-29 ENCOUNTER — Other Ambulatory Visit: Payer: Self-pay | Admitting: Family Medicine

## 2021-04-29 DIAGNOSIS — F988 Other specified behavioral and emotional disorders with onset usually occurring in childhood and adolescence: Secondary | ICD-10-CM

## 2021-04-29 MED ORDER — AMPHETAMINE-DEXTROAMPHETAMINE 30 MG PO TABS: 30.0000 mg | ORAL_TABLET | Freq: Two times a day (BID) | ORAL | 0 refills | Status: DC

## 2021-04-29 NOTE — Telephone Encounter (Signed)
Patient called to request refill of amphetamine-dextroamphetamine (ADDERALL) 30 MG tablet   Last dose taken 10/24.  Pharmacy confirmed as  [id number] [place], [place]  [address] [place] [address]  Phone:  [phone]  Fax:  [phone]  DEA #:  [id number]  Please advise at [phone].

## 2021-04-29 NOTE — Telephone Encounter (Signed)
Ok to refill??  Last office visit 10/01/2020.  Last refill 03/19/2021.

## 2021-05-16 ENCOUNTER — Other Ambulatory Visit: Payer: Self-pay | Admitting: Family Medicine

## 2021-05-16 ENCOUNTER — Other Ambulatory Visit: Payer: Self-pay

## 2021-06-02 ENCOUNTER — Other Ambulatory Visit: Payer: Self-pay | Admitting: Family Medicine

## 2021-06-02 DIAGNOSIS — F988 Other specified behavioral and emotional disorders with onset usually occurring in childhood and adolescence: Secondary | ICD-10-CM

## 2021-06-02 NOTE — Telephone Encounter (Signed)
Patient called to request refill of amphetamine-dextroamphetamine (ADDERALL) 30 MG tablet [[id number]]  Patient only has 1 pill left for tomorrow's dose.  Pharmacy confirmed as  [id number] - [place], [place]  [address] [place] [address]  Phone:  [phone]  Fax:  [phone]  DEA #:  [id number]  Please advise at [phone]

## 2021-06-02 NOTE — Telephone Encounter (Signed)
LOV 10/01/20 Last refill 04/29/21, #60, 0 refills  Please review, thanks!

## 2021-06-03 MED ORDER — AMPHETAMINE-DEXTROAMPHETAMINE 30 MG PO TABS: 30.0000 mg | ORAL_TABLET | Freq: Two times a day (BID) | ORAL | 0 refills | Status: DC

## 2021-06-09 ENCOUNTER — Telehealth: Payer: 59 | Admitting: Family Medicine

## 2021-06-09 DIAGNOSIS — H10023 Other mucopurulent conjunctivitis, bilateral: Secondary | ICD-10-CM

## 2021-06-09 IMAGING — DX DG CHEST 2V
2 series · 2 of 2 positions shown · non-contrast
Comparison: Chest x-ray 11/20/2003.

CLINICAL DATA: Worsening cough.  Shortness of breath.

EXAM:
CHEST - 2 VIEW

[chest pa]
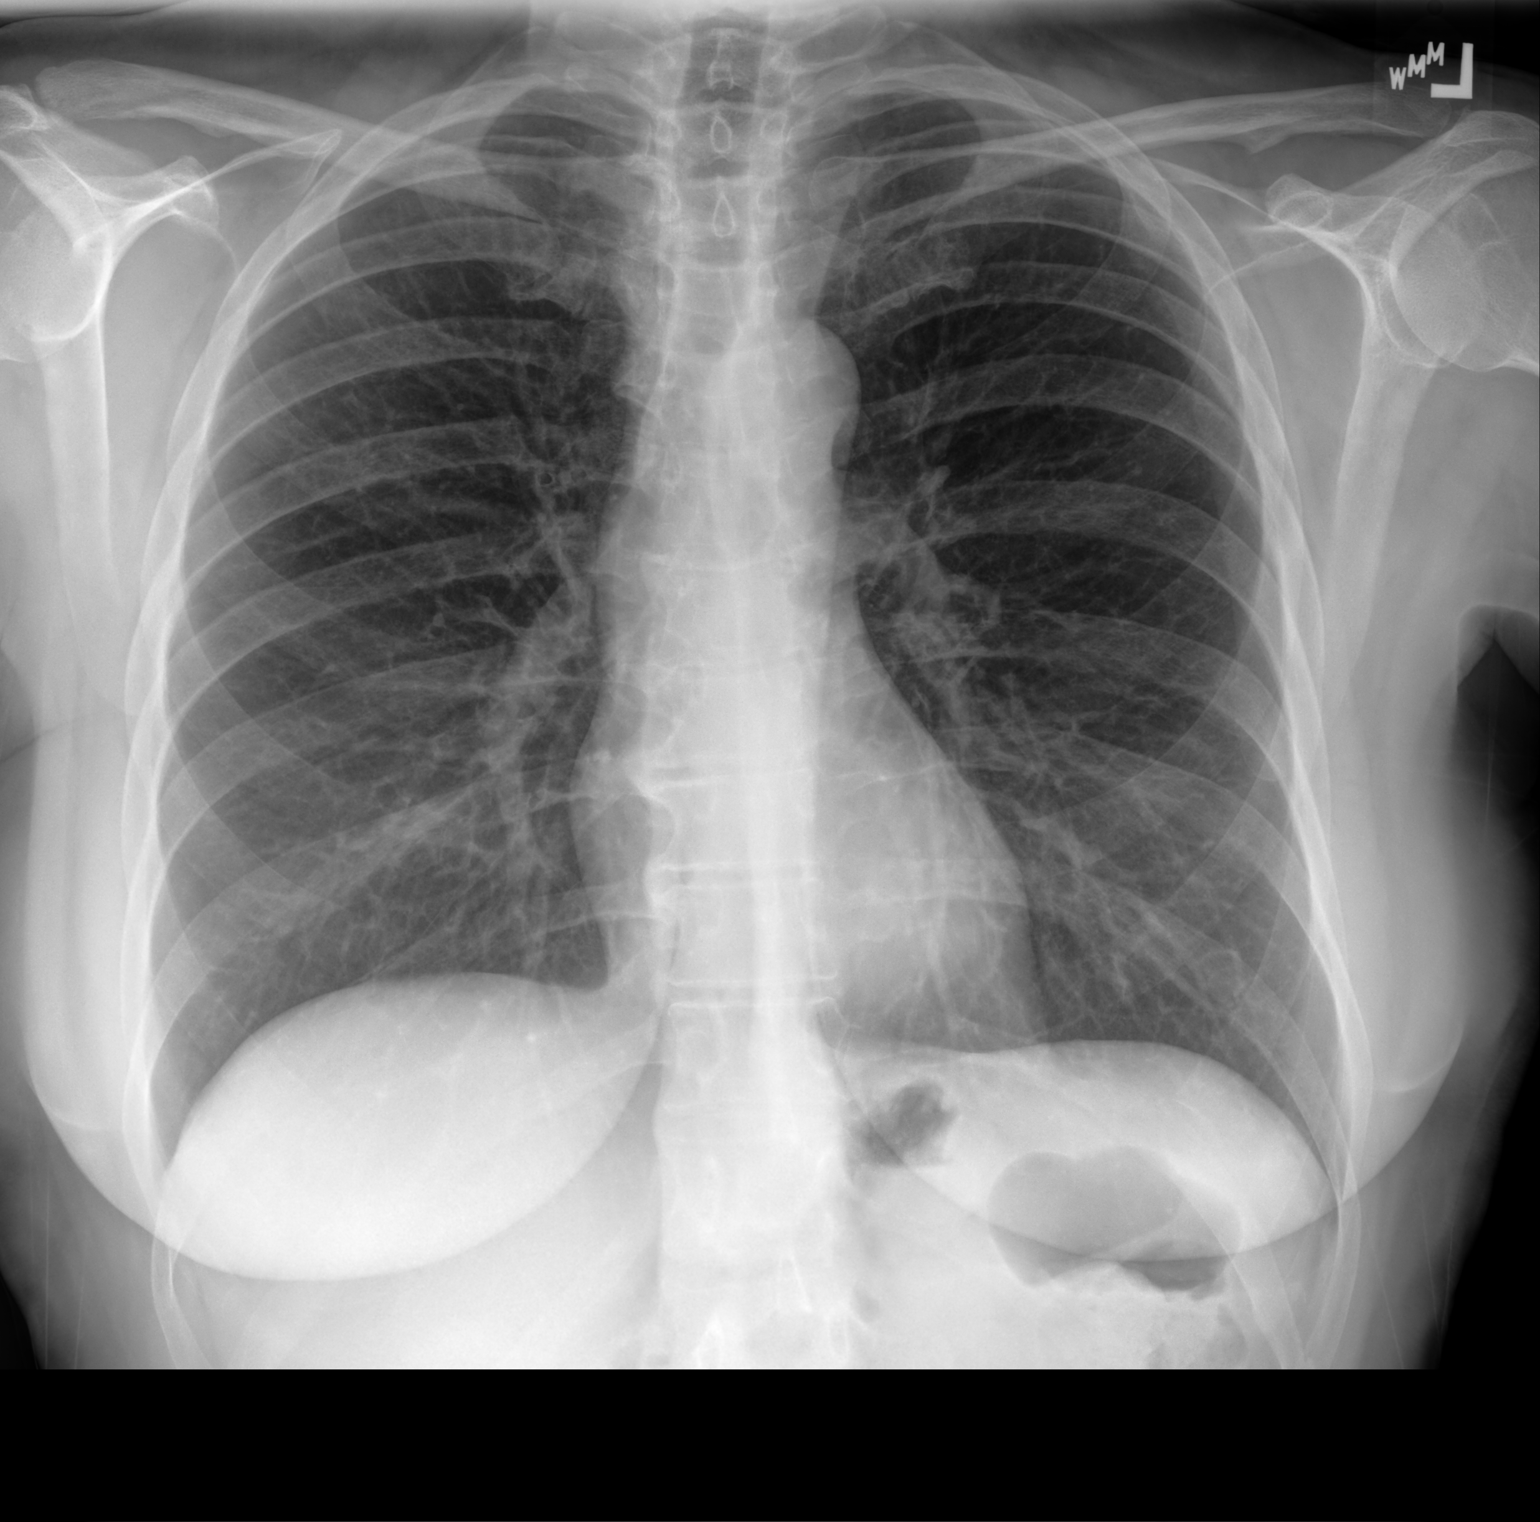

[chest lat]
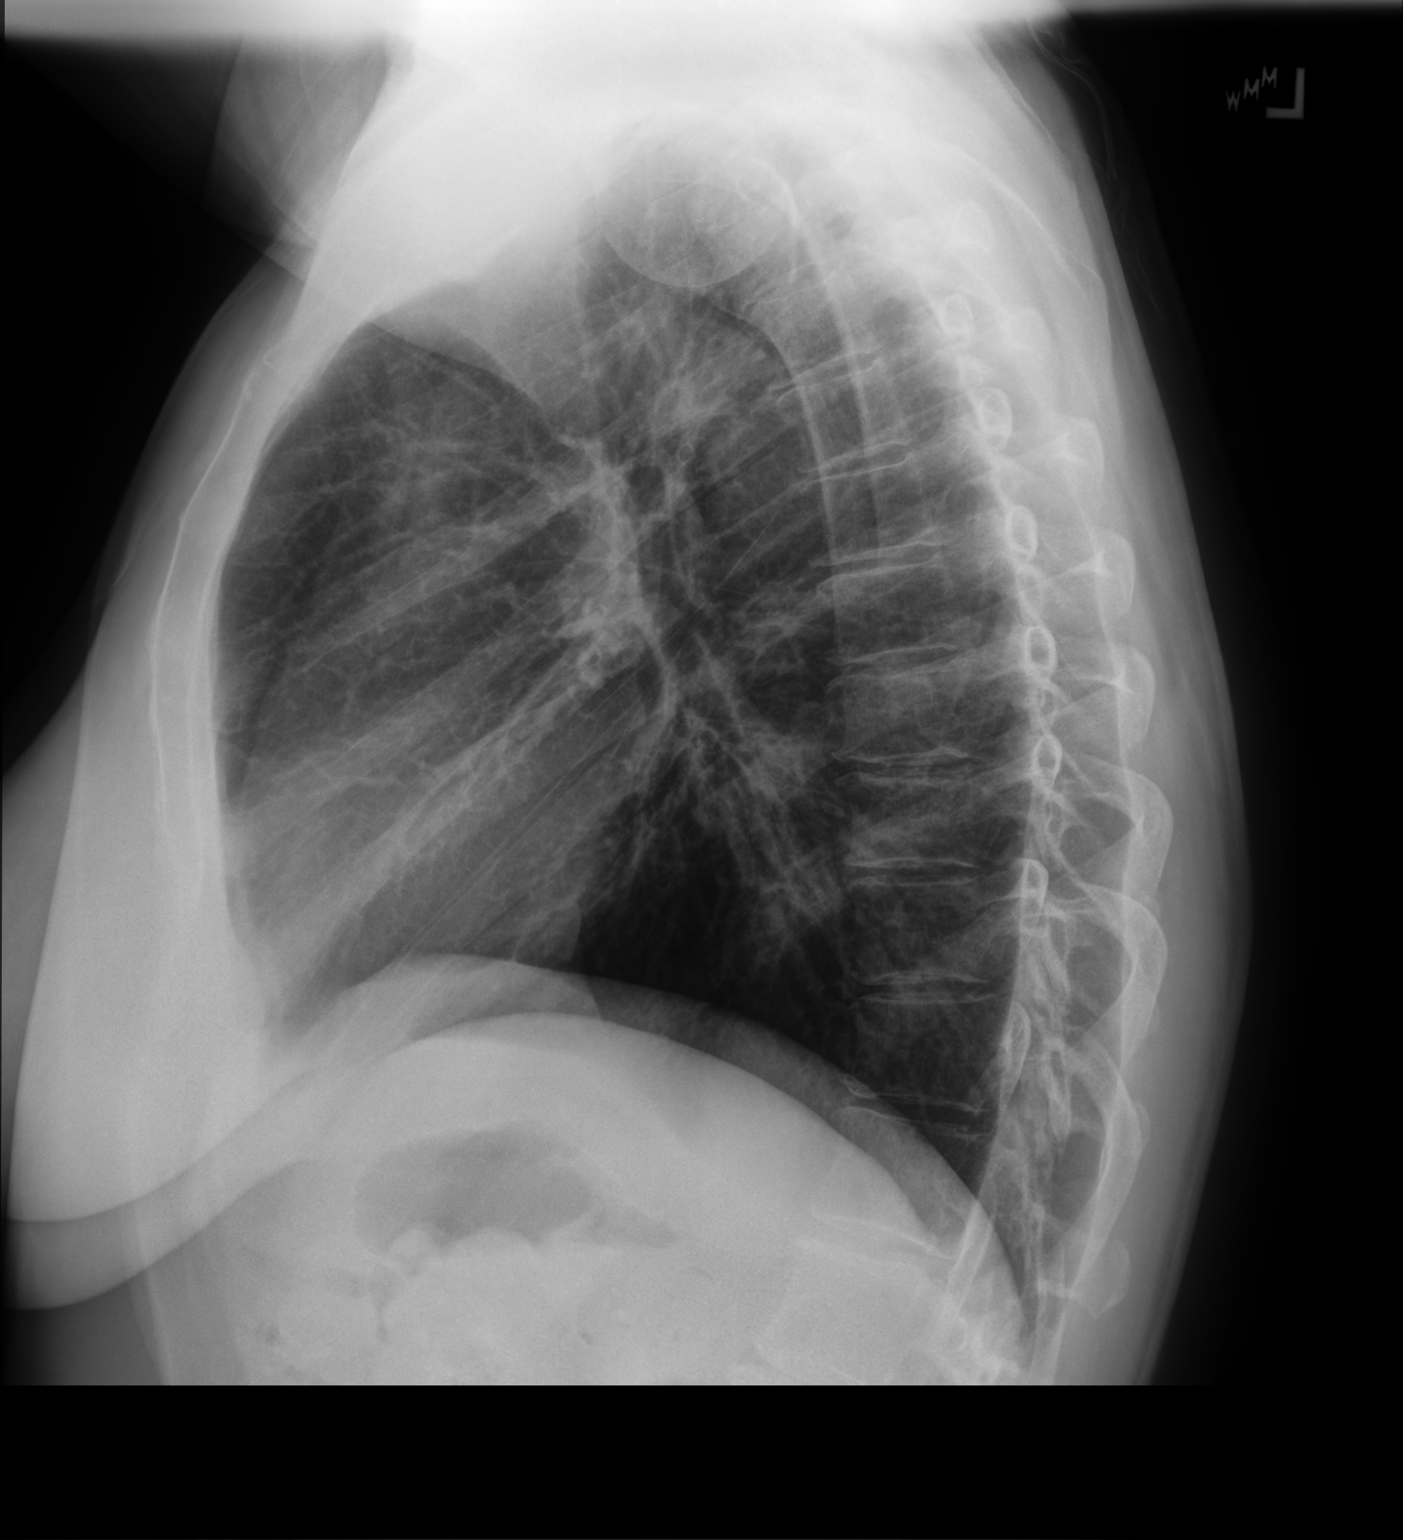

[2 of 2 positions shown; findings below may reference images not displayed]

FINDINGS: Mediastinum and hilar structures normal. Lungs are clear. No pleural
effusion or pneumothorax. Heart size normal. Thoracic spine
scoliosis and degenerative change. No acute bony abnormality.
IMPRESSION: No acute cardiopulmonary disease.

## 2021-06-10 ENCOUNTER — Telehealth: Payer: Self-pay

## 2021-06-10 MED ORDER — POLYMYXIN B-TRIMETHOPRIM 10000-0.1 UNIT/ML-% OP SOLN: 1.0000 [drp] | Freq: Four times a day (QID) | OPHTHALMIC | 0 refills | Status: AC

## 2021-06-10 NOTE — Progress Notes (Signed)

## 2021-06-10 NOTE — Telephone Encounter (Signed)
ATC pt, no answer and vm is FULL

## 2021-06-10 NOTE — Telephone Encounter (Signed)
Pt had e-visit yesterday and was diagnosed with pink eye. Pt also reports nasal congestion, cough, sore throat and headache. Pt is asking if you can review her e-visit and prescribe medication for her symptoms.  Please advise, thanks!

## 2021-06-11 NOTE — Telephone Encounter (Signed)
Spoke with pt and advised. Nothing further needed.  

## 2021-07-02 ENCOUNTER — Other Ambulatory Visit: Payer: Self-pay | Admitting: Family Medicine

## 2021-07-11 ENCOUNTER — Other Ambulatory Visit: Payer: Self-pay | Admitting: Family Medicine

## 2021-07-11 ENCOUNTER — Encounter: Payer: Self-pay | Admitting: Family Medicine

## 2021-07-11 DIAGNOSIS — F988 Other specified behavioral and emotional disorders with onset usually occurring in childhood and adolescence: Secondary | ICD-10-CM

## 2021-07-12 ENCOUNTER — Other Ambulatory Visit: Payer: Self-pay | Admitting: Family Medicine

## 2021-07-14 ENCOUNTER — Other Ambulatory Visit: Payer: Self-pay | Admitting: Family Medicine

## 2021-07-14 DIAGNOSIS — F988 Other specified behavioral and emotional disorders with onset usually occurring in childhood and adolescence: Secondary | ICD-10-CM

## 2021-07-14 MED ORDER — AMPHETAMINE-DEXTROAMPHETAMINE 30 MG PO TABS: 30.0000 mg | ORAL_TABLET | Freq: Two times a day (BID) | ORAL | 0 refills | Status: DC

## 2021-07-31 ENCOUNTER — Telehealth: Payer: 59 | Admitting: Physician Assistant

## 2021-07-31 DIAGNOSIS — B9689 Other specified bacterial agents as the cause of diseases classified elsewhere: Secondary | ICD-10-CM | POA: Diagnosis not present

## 2021-07-31 DIAGNOSIS — J019 Acute sinusitis, unspecified: Secondary | ICD-10-CM | POA: Diagnosis not present

## 2021-07-31 MED ORDER — AMOXICILLIN-POT CLAVULANATE 875-125 MG PO TABS: 1.0000 | ORAL_TABLET | Freq: Two times a day (BID) | ORAL | 0 refills | Status: DC

## 2021-07-31 NOTE — Progress Notes (Signed)

## 2021-07-31 NOTE — Progress Notes (Signed)
I have spent 5 minutes in review of e-visit questionnaire, review and updating patient chart, medical decision making and response to patient.   Needham Biggins Cody Darline Faith, PA-C    

## 2021-08-04 ENCOUNTER — Encounter: Payer: Self-pay | Admitting: Family Medicine

## 2021-08-07 ENCOUNTER — Ambulatory Visit: Payer: 59 | Admitting: Family Medicine

## 2021-08-08 ENCOUNTER — Encounter: Payer: Self-pay | Admitting: Family Medicine

## 2021-08-08 ENCOUNTER — Ambulatory Visit (INDEPENDENT_AMBULATORY_CARE_PROVIDER_SITE_OTHER): Payer: 59 | Admitting: Family Medicine

## 2021-08-08 ENCOUNTER — Other Ambulatory Visit: Payer: Self-pay

## 2021-08-08 VITALS — BP 148/88 | HR 85 | Temp 96.8°F | Resp 18 | Ht 69.0 in | Wt 170.0 lb

## 2021-08-08 DIAGNOSIS — J310 Chronic rhinitis: Secondary | ICD-10-CM

## 2021-08-08 DIAGNOSIS — B9689 Other specified bacterial agents as the cause of diseases classified elsewhere: Secondary | ICD-10-CM | POA: Diagnosis not present

## 2021-08-08 DIAGNOSIS — J019 Acute sinusitis, unspecified: Secondary | ICD-10-CM

## 2021-08-08 DIAGNOSIS — J4541 Moderate persistent asthma with (acute) exacerbation: Secondary | ICD-10-CM | POA: Diagnosis not present

## 2021-08-08 DIAGNOSIS — J329 Chronic sinusitis, unspecified: Secondary | ICD-10-CM

## 2021-08-08 MED ORDER — MONTELUKAST SODIUM 10 MG PO TABS: 10.0000 mg | ORAL_TABLET | Freq: Every day | ORAL | 3 refills | Status: DC

## 2021-08-08 MED ORDER — FLUCONAZOLE 150 MG PO TABS: 150.0000 mg | ORAL_TABLET | Freq: Once | ORAL | 0 refills | Status: AC

## 2021-08-08 MED ORDER — FLUTICASONE PROPIONATE 50 MCG/ACT NA SUSP: 2.0000 | Freq: Every day | NASAL | 6 refills | Status: DC

## 2021-08-08 MED ORDER — AMOXICILLIN 875 MG PO TABS: 875.0000 mg | ORAL_TABLET | Freq: Two times a day (BID) | ORAL | 0 refills | Status: AC

## 2021-08-08 NOTE — Progress Notes (Signed)
Subjective:    Patient ID: Kathryn Murray, female    DOB: [birth year], 61 y.o.   MRN: [id number]  HPI  Despite using Symbicort twice daily, the patient has been suffering with asthma attacks recently.  She is also been battling constant congestion.  She reports runny nose, facial pressure, pain and pressure in her right maxillary sinuses.  She recently went to the dentist due to pain in her teeth.  The dental exam was normal.  The dentist felt that it was most likely due to sinus inflammation.  She reports sneezing, head congestion.  Here today on exam she has clear rhinorrhea and is audibly congested.  Allergies and sinusitis seem to be irritating the asthma.  Sunday she had an asthma attack and had to use her rescue inhaler.  She finds that she is having to use her rescue inhaler more often due to the congestion and sneezing.  She denies any known exposures.  There are no new pets or mold.  She has tried Xyzal with no relief  Past Medical History:  Diagnosis Date   ADD (attention deficit disorder)    GERD (gastroesophageal reflux disease)    Wears glasses    reading   Past Surgical History:  Procedure Laterality Date   ABDOMINAL HYSTERECTOMY  2006   BREAST EXCISIONAL BIOPSY Right    BREAST SURGERY     right br bx x2   LIGAMENT REPAIR Right 01/03/2014   Procedure: RIGHT LONG REPAIR VS RECONSTRUCTION OF RADIAL COLLATERAL LIGAMENT ;  Surgeon: Schuyler Amor, MD;  Location: [place];  Service: Orthopedics;  Laterality: Right;   Current Outpatient Medications on File Prior to Visit  Medication Sig Dispense Refill   albuterol (VENTOLIN HFA) 108 (90 Base) MCG/ACT inhaler INHALE 1-2 PUFFS BY MOUTH EVERY 6 HOURS AS NEEDED FOR WHEEZE OR SHORTNESS OF BREATH 8.5 each 1   amphetamine-dextroamphetamine (ADDERALL) 30 MG tablet Take 1 tablet by mouth 2 (two) times daily. 60 tablet 0   Cholecalciferol (VITAMIN D3) 2000 units TABS Take 1 tablet by mouth daily.     cyclobenzaprine  (FLEXERIL) 10 MG tablet Take 1 tablet (10 mg total) by mouth 3 (three) times daily as needed for muscle spasms. 20 tablet 0   ergocalciferol (VITAMIN D2) 1.25 MG (50000 UT) capsule ergocalciferol (vitamin D2) 1,250 mcg (50,000 unit) capsule  TAKE 1 CAPSULE EVERY WEEK BY ORAL ROUTE.     pantoprazole (PROTONIX) 40 MG tablet TAKE 1 TABLET BY MOUTH EVERY DAY 90 tablet 1   SYMBICORT 160-4.5 MCG/ACT inhaler INHALE 2 PUFFS INTO THE LUNGS TWICE A DAY 10.2 each 0   zolpidem (AMBIEN CR) 6.25 MG CR tablet Take 6.25 mg by mouth at bedtime as needed.     levocetirizine (XYZAL) 5 MG tablet TAKE 1 TABLET BY MOUTH EVERY DAY IN THE EVENING (Patient not taking: Reported on 08/08/2021) 90 tablet 1   No current facility-administered medications on file prior to visit.    Allergies  Allergen Reactions   Sulphadimidine [Sulfamethazine] Rash   Social History   Socioeconomic History   Marital status: Married    Spouse name: Not on file   Number of children: Not on file   Years of education: Not on file   Highest education level: Not on file  Occupational History   Not on file  Tobacco Use   Smoking status: Never   Smokeless tobacco: Never  Substance and Sexual Activity   Alcohol use: Yes    Alcohol/week: 0.0  standard drinks    Comment: Wine twice a week   Drug use: No   Sexual activity: Not on file  Other Topics Concern   Not on file  Social History Narrative   Not on file   Social Determinants of Health   Financial Resource Strain: Not on file  Food Insecurity: Not on file  Transportation Needs: Not on file  Physical Activity: Not on file  Stress: Not on file  Social Connections: Not on file  Intimate Partner Violence: Not on file      Review of Systems  All other systems reviewed and are negative.     Objective:   Physical Exam Vitals reviewed.  Constitutional:      Appearance: Normal appearance. She is not ill-appearing, toxic-appearing or diaphoretic.  HENT:     Right Ear:  Tympanic membrane and ear canal normal.     Left Ear: Tympanic membrane and ear canal normal.     Nose: Congestion and rhinorrhea present.     Right Turbinates: Swollen.     Left Turbinates: Swollen.     Right Sinus: Maxillary sinus tenderness and frontal sinus tenderness present.     Mouth/Throat:     Mouth: Mucous membranes are moist.     Pharynx: Oropharynx is clear. No oropharyngeal exudate or posterior oropharyngeal erythema.  Cardiovascular:     Rate and Rhythm: Normal rate and regular rhythm.     Pulses: Normal pulses.     Heart sounds: Normal heart sounds. No murmur heard. Pulmonary:     Effort: Pulmonary effort is normal. No respiratory distress.     Breath sounds: Normal breath sounds. No stridor. No wheezing, rhonchi or rales.  Chest:     Chest wall: No tenderness.  Abdominal:     General: Abdomen is flat. Bowel sounds are normal. There is no distension.     Palpations: Abdomen is soft.  Musculoskeletal:     Cervical back: Neck supple. No rigidity or tenderness.  Lymphadenopathy:     Cervical: No cervical adenopathy.  Neurological:     General: No focal deficit present.     Mental Status: She is alert and oriented to person, place, and time. Mental status is at baseline.     Cranial Nerves: No cranial nerve deficit.     Sensory: No sensory deficit.     Motor: No weakness.     Deep Tendon Reflexes: Reflexes normal.          Assessment & Plan:  Acute bacterial sinusitis  Moderate persistent asthma with acute exacerbation  Rhinosinusitis I believe her asthma is being worsened due to sinusitis.  Given the pain, I feel the patient may have a bacterial sinus infection but also believe allergies are a trigger.  Therefore I will treat her sinus infection with amoxicillin 875 mg twice daily for 10 days and I will treat allergies with combination Flonase and Singulair.  If the patient's symptoms or not improving on Flonase and Singulair in 1 month, I would recommend an  allergist referral.  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

## 2021-08-10 ENCOUNTER — Other Ambulatory Visit: Payer: Self-pay | Admitting: Family Medicine

## 2021-08-20 ENCOUNTER — Other Ambulatory Visit: Payer: Self-pay | Admitting: Family Medicine

## 2021-08-20 DIAGNOSIS — F988 Other specified behavioral and emotional disorders with onset usually occurring in childhood and adolescence: Secondary | ICD-10-CM

## 2021-08-21 NOTE — Telephone Encounter (Signed)
LOV 08/08/21 Last refill 07/14/21, #60, 0 refills  Please review, thanks!

## 2021-08-22 MED ORDER — AMPHETAMINE-DEXTROAMPHETAMINE 30 MG PO TABS: 30.0000 mg | ORAL_TABLET | Freq: Two times a day (BID) | ORAL | 0 refills | Status: DC

## 2021-08-23 ENCOUNTER — Encounter: Payer: Self-pay | Admitting: Family Medicine

## 2021-08-23 DIAGNOSIS — F988 Other specified behavioral and emotional disorders with onset usually occurring in childhood and adolescence: Secondary | ICD-10-CM

## 2021-08-25 ENCOUNTER — Telehealth: Payer: Self-pay | Admitting: Family Medicine

## 2021-08-25 MED ORDER — AMPHETAMINE-DEXTROAMPHETAMINE 30 MG PO TABS: 30.0000 mg | ORAL_TABLET | Freq: Two times a day (BID) | ORAL | 0 refills | Status: DC

## 2021-08-25 NOTE — Telephone Encounter (Signed)
Please send new Adderall rx. Current rx at [facility] has been canceled.  Thank you!

## 2021-08-25 NOTE — Telephone Encounter (Signed)
Patient left message to request for Rx for amphetamine-dextroamphetamine (ADDERALL) 30 MG tablet [[id number]]  [facility] [address] [address]; completely out of stock. Patient completely out of med.    Please advise at [phone]

## 2021-08-25 NOTE — Telephone Encounter (Signed)
Please see My Chart message dated for 08/23/21.

## 2021-09-23 ENCOUNTER — Other Ambulatory Visit: Payer: Self-pay | Admitting: Family Medicine

## 2021-09-23 DIAGNOSIS — F988 Other specified behavioral and emotional disorders with onset usually occurring in childhood and adolescence: Secondary | ICD-10-CM

## 2021-09-24 NOTE — Telephone Encounter (Signed)
LOV 08/08/21 ?Last refill 08/25/21, #30, 0 refills ? ?Please review, thanks!  ?

## 2021-09-25 MED ORDER — AMPHETAMINE-DEXTROAMPHETAMINE 30 MG PO TABS: 30.0000 mg | ORAL_TABLET | Freq: Two times a day (BID) | ORAL | 0 refills | Status: DC

## 2021-10-13 ENCOUNTER — Other Ambulatory Visit: Payer: Self-pay | Admitting: Family Medicine

## 2021-10-13 ENCOUNTER — Other Ambulatory Visit: Payer: Self-pay

## 2021-10-13 MED ORDER — ZOLPIDEM TARTRATE ER 6.25 MG PO TBCR: 6.2500 mg | EXTENDED_RELEASE_TABLET | Freq: Every evening | ORAL | 3 refills | Status: DC | PRN

## 2021-10-13 NOTE — Telephone Encounter (Signed)
LOV 08/08/21 ?Last refill 05/16/21, #30, 0 refills ? ?Please review, thanks! ? ?

## 2021-10-29 ENCOUNTER — Other Ambulatory Visit: Payer: Self-pay | Admitting: Family Medicine

## 2021-10-29 DIAGNOSIS — F988 Other specified behavioral and emotional disorders with onset usually occurring in childhood and adolescence: Secondary | ICD-10-CM

## 2021-10-29 NOTE — Telephone Encounter (Signed)
LOV 08/08/21 ?Last refill 09/25/21, #60, 0 refills ? ?Please review, thanks! ? ?

## 2021-10-29 NOTE — Telephone Encounter (Signed)
Duplicate request

## 2021-10-30 MED ORDER — AMPHETAMINE-DEXTROAMPHETAMINE 30 MG PO TABS: 30.0000 mg | ORAL_TABLET | Freq: Two times a day (BID) | ORAL | 0 refills | Status: DC

## 2021-11-05 ENCOUNTER — Other Ambulatory Visit: Payer: Self-pay | Admitting: Family Medicine

## 2021-11-05 NOTE — Telephone Encounter (Signed)
Requested Prescriptions  ?Pending Prescriptions Disp Refills  ?? montelukast (SINGULAIR) 10 MG tablet [Pharmacy Med Name: MONTELUKAST SOD 10 MG TABLET] 90 tablet 0  ?  Sig: TAKE 1 TABLET BY MOUTH EVERYDAY AT BEDTIME  ?  ? Pulmonology:  Leukotriene Inhibitors Passed - 11/05/2021  2:39 AM  ?  ?  Passed - Valid encounter within last 12 months  ?  Recent Outpatient Visits   ?      ? 2 months ago Acute bacterial sinusitis  ? [facility], Cammie Mcgee, MD  ? 1 year ago Moderate persistent asthma with acute exacerbation  ? [facility] Pickard, Cammie Mcgee, MD  ? 1 year ago Esophageal dysphagia  ? [facility] Pickard, Cammie Mcgee, MD  ? 2 years ago Right sided sciatica  ? [facility] Pickard, Cammie Mcgee, MD  ? 3 years ago Cervical radiculopathy  ? [facility] Pickard, Cammie Mcgee, MD  ?  ?  ? ?  ?  ?  ? ?

## 2021-11-09 ENCOUNTER — Other Ambulatory Visit: Payer: Self-pay | Admitting: Family Medicine

## 2021-11-10 NOTE — Telephone Encounter (Signed)
Requested Prescriptions  ?Pending Prescriptions Disp Refills  ?? pantoprazole (PROTONIX) 40 MG tablet [Pharmacy Med Name: PANTOPRAZOLE SOD DR 40 MG TAB] 90 tablet 1  ?  Sig: TAKE 1 TABLET BY MOUTH EVERY DAY  ?  ? Gastroenterology: Proton Pump Inhibitors Passed - 11/09/2021  9:36 AM  ?  ?  Passed - Valid encounter within last 12 months  ?  Recent Outpatient Visits   ?      ? 3 months ago Acute bacterial sinusitis  ? [facility] Pickard, Cammie Mcgee, MD  ? 1 year ago Moderate persistent asthma with acute exacerbation  ? [facility] Pickard, Cammie Mcgee, MD  ? 1 year ago Esophageal dysphagia  ? [facility] Pickard, Cammie Mcgee, MD  ? 2 years ago Right sided sciatica  ? [facility] Pickard, Cammie Mcgee, MD  ? 3 years ago Cervical radiculopathy  ? [facility] Pickard, Cammie Mcgee, MD  ?  ?  ? ?  ?  ?  ? ? ?

## 2021-11-28 ENCOUNTER — Other Ambulatory Visit: Payer: Self-pay | Admitting: Family Medicine

## 2021-11-28 DIAGNOSIS — F988 Other specified behavioral and emotional disorders with onset usually occurring in childhood and adolescence: Secondary | ICD-10-CM

## 2021-11-28 MED ORDER — AMPHETAMINE-DEXTROAMPHETAMINE 30 MG PO TABS: 30.0000 mg | ORAL_TABLET | Freq: Two times a day (BID) | ORAL | 0 refills | Status: DC

## 2021-11-28 NOTE — Telephone Encounter (Signed)
LOV 08/08/21 Last refill 10/30/21, #60, 0 refills  Please review, thanks!

## 2021-12-02 ENCOUNTER — Other Ambulatory Visit: Payer: Self-pay

## 2021-12-02 DIAGNOSIS — F988 Other specified behavioral and emotional disorders with onset usually occurring in childhood and adolescence: Secondary | ICD-10-CM

## 2021-12-02 MED ORDER — AMPHETAMINE-DEXTROAMPHETAMINE 30 MG PO TABS: 30.0000 mg | ORAL_TABLET | Freq: Two times a day (BID) | ORAL | 0 refills | Status: DC

## 2021-12-02 NOTE — Telephone Encounter (Signed)
Requested medication (s) are due for refill today: Yes  Requested medication (s) are on the active medication list: Yes  Last refill:  11/28/21  Future visit scheduled: No  Notes to clinic:  Unable to refill per protocol, cannot delegate. [facility] due to [facility] of stock, patient out of medicine     Requested Prescriptions  Pending Prescriptions Disp Refills   amphetamine-dextroamphetamine (ADDERALL) 30 MG tablet 60 tablet 0    Sig: Take 1 tablet by mouth 2 (two) times daily.     Not Delegated - Psychiatry:  Stimulants/ADHD Failed - 12/02/2021  3:22 PM      Failed - This refill cannot be delegated      Failed - Urine Drug Screen completed in last 360 days      Failed - Last BP in normal range    BP Readings from Last 1 Encounters:  08/08/21 (!) 148/88         Passed - Last Heart Rate in normal range    Pulse Readings from Last 1 Encounters:  08/08/21 85         Passed - Valid encounter within last 6 months    Recent Outpatient Visits           3 months ago Acute bacterial sinusitis   [place] Susy Frizzle, MD   1 year ago Moderate persistent asthma with acute exacerbation   [place] Susy Frizzle, MD   1 year ago Esophageal dysphagia   [place], Warren T, MD   3 years ago Right sided sciatica   [place] Dennard Schaumann Cammie Mcgee, MD   3 years ago Cervical radiculopathy   [place] Pickard, Cammie Mcgee, MD

## 2021-12-02 NOTE — Telephone Encounter (Signed)
Pt called in stating that her prescription of amphetamine-dextroamphetamine (ADDERALL) 30 MG tablet could not be filled at her current pharmacy because they are out of this med. Pt is asking if the [facility] could [facility] [address]. Pt states that she is completely out of this med. Please advise.  Cb#: [phone]

## 2022-01-01 ENCOUNTER — Other Ambulatory Visit: Payer: Self-pay | Admitting: Family Medicine

## 2022-01-01 DIAGNOSIS — F988 Other specified behavioral and emotional disorders with onset usually occurring in childhood and adolescence: Secondary | ICD-10-CM

## 2022-01-01 NOTE — Telephone Encounter (Signed)
LOV 08/08/21 Last refill 12/02/21, #60, 0 refills  Please review, thanks!

## 2022-01-02 MED ORDER — AMPHETAMINE-DEXTROAMPHETAMINE 30 MG PO TABS: 30.0000 mg | ORAL_TABLET | Freq: Two times a day (BID) | ORAL | 0 refills | Status: DC

## 2022-02-03 ENCOUNTER — Other Ambulatory Visit: Payer: Self-pay | Admitting: Family Medicine

## 2022-02-03 DIAGNOSIS — F988 Other specified behavioral and emotional disorders with onset usually occurring in childhood and adolescence: Secondary | ICD-10-CM

## 2022-02-04 NOTE — Telephone Encounter (Signed)
LOV 08/08/21 Last refill 01/02/22, #60, 0 refills  Please review, thanks!

## 2022-02-05 MED ORDER — AMPHETAMINE-DEXTROAMPHETAMINE 30 MG PO TABS: 30.0000 mg | ORAL_TABLET | Freq: Two times a day (BID) | ORAL | 0 refills | Status: DC

## 2022-02-06 ENCOUNTER — Other Ambulatory Visit: Payer: Self-pay | Admitting: Family Medicine

## 2022-02-06 DIAGNOSIS — F988 Other specified behavioral and emotional disorders with onset usually occurring in childhood and adolescence: Secondary | ICD-10-CM

## 2022-02-06 MED ORDER — AMPHETAMINE-DEXTROAMPHETAMINE 30 MG PO TABS: 30.0000 mg | ORAL_TABLET | Freq: Two times a day (BID) | ORAL | 0 refills | Status: DC

## 2022-02-06 NOTE — Telephone Encounter (Signed)
PT NEEDS ANNUAL W/PCP FOR FUTURE REFILLS

## 2022-02-07 ENCOUNTER — Other Ambulatory Visit: Payer: Self-pay | Admitting: Family Medicine

## 2022-02-07 DIAGNOSIS — F988 Other specified behavioral and emotional disorders with onset usually occurring in childhood and adolescence: Secondary | ICD-10-CM

## 2022-02-09 MED ORDER — AMPHETAMINE-DEXTROAMPHETAMINE 30 MG PO TABS: 30.0000 mg | ORAL_TABLET | Freq: Two times a day (BID) | ORAL | 0 refills | Status: DC

## 2022-02-10 ENCOUNTER — Other Ambulatory Visit: Payer: Self-pay | Admitting: Family Medicine

## 2022-03-03 ENCOUNTER — Other Ambulatory Visit: Payer: Self-pay | Admitting: Family Medicine

## 2022-03-04 NOTE — Telephone Encounter (Signed)
Requested medication (s) are due for refill today: Yes  Requested medication (s) are on the active medication list: Yes  Last refill:  02/06/22  Future visit scheduled: No  Notes to clinic:  Left message to call and make appointment.    Requested Prescriptions  Pending Prescriptions Disp Refills   montelukast (SINGULAIR) 10 MG tablet [Pharmacy Med Name: MONTELUKAST SOD 10 MG TABLET] 30 tablet 0    Sig: TAKE 1 TABLET BY MOUTH EVERYDAY AT BEDTIME     Pulmonology:  Leukotriene Inhibitors Passed - 03/03/2022  1:34 PM      Passed - Valid encounter within last 12 months    Recent Outpatient Visits           6 months ago Acute bacterial sinusitis   [place] Susy Frizzle, MD   1 year ago Moderate persistent asthma with acute exacerbation   [place] Pickard, Cammie Mcgee, MD   1 year ago Esophageal dysphagia   [place] Susy Frizzle, MD   3 years ago Right sided sciatica   [place] Denn[place] Schaumann, Cammie Mcgee, MD   3 years ago Cervical radiculopathy   [place] Pickard, Cammie Mcgee, MD               pantoprazole (PROTONIX) 40 MG tablet [Pharmacy Med Name: PANTOPRAZOLE SOD DR 40 MG TAB] 30 tablet 0    Sig: TAKE 1 TABLET BY MOUTH EVERY DAY     Gastroenterology: Proton Pump Inhibitors Passed - 03/03/2022  1:34 PM      Passed - Valid encounter within last 12 months    Recent Outpatient Visits           6 months ago Acute bacterial sinusitis   [place] Susy Frizzle, MD   1 year ago Moderate persistent asthma with acute exacerbation   [place] Susy Frizzle, MD   1 year ago Esophageal dysphagia   [place], Warren T, MD   3 years ago Right sided sciatica   [place] Pickard, Cammie Mcgee, MD   3 years ago Cervical radiculopathy   [place] Pickard, Cammie Mcgee, MD

## 2022-03-16 ENCOUNTER — Telehealth: Payer: Self-pay | Admitting: Family Medicine

## 2022-03-16 NOTE — Telephone Encounter (Signed)
Left message to return call. Need to schedule cpe and labs for additional med refills.

## 2022-03-29 ENCOUNTER — Other Ambulatory Visit: Payer: Self-pay | Admitting: Family Medicine

## 2022-03-30 NOTE — Telephone Encounter (Signed)
Requested Prescriptions  Pending Prescriptions Disp Refills  . pantoprazole (PROTONIX) 40 MG tablet [Pharmacy Med Name: PANTOPRAZOLE SOD DR 40 MG TAB] 90 tablet 1    Sig: TAKE 1 TABLET BY MOUTH EVERY DAY     Gastroenterology: Proton Pump Inhibitors Passed - 03/29/2022 12:32 PM      Passed - Valid encounter within last 12 months    Recent Outpatient Visits          7 months ago Acute bacterial sinusitis   [place] Susy Frizzle, MD   1 year ago Moderate persistent asthma with acute exacerbation   [place] Pickard, Cammie Mcgee, MD   1 year ago Esophageal dysphagia   [place] Susy Frizzle, MD   3 years ago Right sided sciatica   [place] Dennard Schaumann, Cammie Mcgee, MD   3 years ago Cervical radiculopathy   [place] Pickard, Cammie Mcgee, MD             . montelukast (SINGULAIR) 10 MG tablet [Pharmacy Med Name: MONTELUKAST SOD 10 MG TABLET] 90 tablet 1    Sig: TAKE 1 TABLET BY MOUTH EVERYDAY AT BEDTIME     Pulmonology:  Leukotriene Inhibitors Passed - 03/29/2022 12:32 PM      Passed - Valid encounter within last 12 months    Recent Outpatient Visits          7 months ago Acute bacterial sinusitis   [place] Susy Frizzle, MD   1 year ago Moderate persistent asthma with acute exacerbation   [place] Susy Frizzle, MD   1 year ago Esophageal dysphagia   [place], Warren T, MD   3 years ago Right sided sciatica   [place] Pickard, Cammie Mcgee, MD   3 years ago Cervical radiculopathy   [place] Pickard, Cammie Mcgee, MD

## 2022-04-10 ENCOUNTER — Other Ambulatory Visit: Payer: Self-pay | Admitting: Family Medicine

## 2022-04-10 DIAGNOSIS — F988 Other specified behavioral and emotional disorders with onset usually occurring in childhood and adolescence: Secondary | ICD-10-CM

## 2022-04-13 MED ORDER — AMPHETAMINE-DEXTROAMPHETAMINE 30 MG PO TABS: 30.0000 mg | ORAL_TABLET | Freq: Two times a day (BID) | ORAL | 0 refills | Status: DC

## 2022-05-15 ENCOUNTER — Other Ambulatory Visit: Payer: Self-pay | Admitting: Family Medicine

## 2022-05-15 ENCOUNTER — Other Ambulatory Visit: Payer: Self-pay

## 2022-05-15 DIAGNOSIS — F988 Other specified behavioral and emotional disorders with onset usually occurring in childhood and adolescence: Secondary | ICD-10-CM

## 2022-05-15 NOTE — Telephone Encounter (Signed)
Requested Prescriptions  Pending Prescriptions Disp Refills   fluticasone (FLONASE) 50 MCG/ACT nasal spray [Pharmacy Med Name: FLUTICASONE PROP 50 MCG SPRAY] 48 mL 2    Sig: SPRAY 2 SPRAYS INTO EACH NOSTRIL EVERY DAY     Ear, Nose, and Throat: Nasal Preparations - Corticosteroids Passed - 05/15/2022 12:31 PM      Passed - Valid encounter within last 12 months    Recent Outpatient Visits           9 months ago Acute bacterial sinusitis   [place] Susy Frizzle, MD   1 year ago Moderate persistent asthma with acute exacerbation   [place] Susy Frizzle, MD   1 year ago Esophageal dysphagia   [place], Warren T, MD   3 years ago Right sided sciatica   [place] Dennard Schaumann Cammie Mcgee, MD   3 years ago Cervical radiculopathy   [place] Pickard, Cammie Mcgee, MD

## 2022-05-15 NOTE — Telephone Encounter (Signed)
  Prescription Request  05/15/2022  Is this a "Controlled Substance" medicine? Yes  LOV: 03/16/2022   What is the name of the medication or equipment? zolpidem (AMBIEN CR) 6.25 MG CR tablet [[id number]]   Have you contacted your pharmacy to request a refill? Yes   Which pharmacy would you like this sent to?  [id number]: [phone]: [phone]  Patient notified that their request is being sent to the clinical staff for review and that they should receive a response within 2 business days.   Please advise at [phone](mobile)

## 2022-05-18 ENCOUNTER — Other Ambulatory Visit: Payer: Self-pay

## 2022-05-18 MED ORDER — ZOLPIDEM TARTRATE ER 6.25 MG PO TBCR: 6.2500 mg | EXTENDED_RELEASE_TABLET | Freq: Every evening | ORAL | 3 refills | Status: DC | PRN

## 2022-05-18 MED ORDER — AMPHETAMINE-DEXTROAMPHETAMINE 30 MG PO TABS: 30.0000 mg | ORAL_TABLET | Freq: Two times a day (BID) | ORAL | 0 refills | Status: DC

## 2022-06-15 ENCOUNTER — Other Ambulatory Visit: Payer: Self-pay | Admitting: Family Medicine

## 2022-06-15 DIAGNOSIS — F988 Other specified behavioral and emotional disorders with onset usually occurring in childhood and adolescence: Secondary | ICD-10-CM

## 2022-06-15 MED ORDER — AMPHETAMINE-DEXTROAMPHETAMINE 30 MG PO TABS: 30.0000 mg | ORAL_TABLET | Freq: Two times a day (BID) | ORAL | 0 refills | Status: DC

## 2022-07-17 ENCOUNTER — Other Ambulatory Visit: Payer: Self-pay | Admitting: Family Medicine

## 2022-07-17 ENCOUNTER — Telehealth: Payer: Self-pay

## 2022-07-17 DIAGNOSIS — F988 Other specified behavioral and emotional disorders with onset usually occurring in childhood and adolescence: Secondary | ICD-10-CM

## 2022-07-17 MED ORDER — AMPHETAMINE-DEXTROAMPHETAMINE 30 MG PO TABS: 30.0000 mg | ORAL_TABLET | Freq: Two times a day (BID) | ORAL | 0 refills | Status: DC

## 2022-07-17 NOTE — Telephone Encounter (Signed)
Pt called in to request a courtesy refill of amphetamine-dextroamphetamine (ADDERALL) 30 MG tablet [[id number]]. Pt just asks for enough to last until her f/u appt on 1/16. Pt states she will be out of this med before this appt. Please advise.  Cb#: [phone]  LOV: 07/14/21 (UPCOMING APPT 01/16)  [id number] [place] [facility] [address] AT

## 2022-07-21 ENCOUNTER — Ambulatory Visit: Payer: 59 | Admitting: Family Medicine

## 2022-07-23 ENCOUNTER — Ambulatory Visit: Payer: 59 | Admitting: Family Medicine

## 2022-07-23 ENCOUNTER — Encounter: Payer: Self-pay | Admitting: Family Medicine

## 2022-07-23 VITALS — BP 140/82 | HR 92 | Temp 99.0°F | Ht 69.0 in | Wt 167.6 lb

## 2022-07-23 DIAGNOSIS — Z13220 Encounter for screening for lipoid disorders: Secondary | ICD-10-CM

## 2022-07-23 DIAGNOSIS — K219 Gastro-esophageal reflux disease without esophagitis: Secondary | ICD-10-CM

## 2022-07-23 DIAGNOSIS — Z1211 Encounter for screening for malignant neoplasm of colon: Secondary | ICD-10-CM

## 2022-07-23 DIAGNOSIS — F988 Other specified behavioral and emotional disorders with onset usually occurring in childhood and adolescence: Secondary | ICD-10-CM

## 2022-07-23 MED ORDER — FAMOTIDINE 40 MG PO TABS: 40.0000 mg | ORAL_TABLET | Freq: Every day | ORAL | 3 refills | Status: DC

## 2022-07-23 NOTE — Progress Notes (Signed)
Subjective:    Patient ID: Kathryn Murray, female    DOB: 1961-03-08, 62 y.o.   MRN: [id number]  HPI  Patient is a 62 year old Caucasian female here today complaining of refractory GERD.  She is currently on Protonix 40 mg daily.  She still has breakthrough acid reflux at night especially.  Sometimes she has to take Tums in addition to the Protonix.  She denies any melena or hematochezia.  She gets her mammogram and her Pap smear through her gynecologist.  She states that she is overdue for colonoscopy.  Last colonoscopy was in 2017.  At that time she was found to have 2, tubular adenomas.  They recommended a repeat colonoscopy in 5 years.  She would like me to schedule her to see gastroenterology for the colonoscopy.  She is long overdue for lab work.  Her blood pressure today is borderline elevated.  We have no other recent blood pressures to compare to  Past Medical History:  Diagnosis Date   ADD (attention deficit disorder)    GERD (gastroesophageal reflux disease)    Wears glasses    reading   Past Surgical History:  Procedure Laterality Date   ABDOMINAL HYSTERECTOMY  2006   BREAST EXCISIONAL BIOPSY Right    BREAST SURGERY     right br bx x2   LIGAMENT REPAIR Right 01/03/2014   Procedure: RIGHT LONG REPAIR VS RECONSTRUCTION OF RADIAL COLLATERAL LIGAMENT ;  Surgeon: Schuyler Amor, MD;  Location: [place];  Service: Orthopedics;  Laterality: Right;   Current Outpatient Medications on File Prior to Visit  Medication Sig Dispense Refill   albuterol (VENTOLIN HFA) 108 (90 Base) MCG/ACT inhaler INHALE 1-2 PUFFS BY MOUTH EVERY 6 HOURS AS NEEDED FOR WHEEZE OR SHORTNESS OF BREATH 8.5 each 1   amphetamine-dextroamphetamine (ADDERALL) 30 MG tablet Take 1 tablet by mouth 2 (two) times daily. 60 tablet 0   Cholecalciferol (VITAMIN D3) 2000 units TABS Take 1 tablet by mouth daily.     ergocalciferol (VITAMIN D2) 1.25 MG (50000 UT) capsule ergocalciferol (vitamin D2) 1,250 mcg  (50,000 unit) capsule  TAKE 1 CAPSULE EVERY WEEK BY ORAL ROUTE.     fluticasone (FLONASE) 50 MCG/ACT nasal spray SPRAY 2 SPRAYS INTO EACH NOSTRIL EVERY DAY 48 mL 2   montelukast (SINGULAIR) 10 MG tablet TAKE 1 TABLET BY MOUTH EVERYDAY AT BEDTIME 90 tablet 1   pantoprazole (PROTONIX) 40 MG tablet TAKE 1 TABLET BY MOUTH EVERY DAY 90 tablet 1   SYMBICORT 160-4.5 MCG/ACT inhaler INHALE 2 PUFFS INTO THE LUNGS TWICE A DAY 10.2 each 3   zolpidem (AMBIEN CR) 6.25 MG CR tablet Take 1 tablet (6.25 mg total) by mouth at bedtime as needed. 30 tablet 3   No current facility-administered medications on file prior to visit.    Allergies  Allergen Reactions   Sulphadimidine [Sulfamethazine] Rash   Social History   Socioeconomic History   Marital status: Married    Spouse name: Not on file   Number of children: Not on file   Years of education: Not on file   Highest education level: Not on file  Occupational History   Not on file  Tobacco Use   Smoking status: Never   Smokeless tobacco: Never  Substance and Sexual Activity   Alcohol use: Yes    Alcohol/week: 0.0 standard drinks of alcohol    Comment: Wine twice a week   Drug use: No   Sexual activity: Not on file  Other Topics  Concern   Not on file  Social History Narrative   Not on file   Social Determinants of Health   Financial Resource Strain: Not on file  Food Insecurity: Not on file  Transportation Needs: Not on file  Physical Activity: Not on file  Stress: Not on file  Social Connections: Not on file  Intimate Partner Violence: Not on file      Review of Systems  All other systems reviewed and are negative.      Objective:   Physical Exam Vitals reviewed.  Constitutional:      Appearance: Normal appearance. She is not ill-appearing, toxic-appearing or diaphoretic.  HENT:     Mouth/Throat:     Mouth: Mucous membranes are moist.     Pharynx: Oropharynx is clear. No oropharyngeal exudate or posterior oropharyngeal  erythema.  Cardiovascular:     Rate and Rhythm: Normal rate and regular rhythm.     Pulses: Normal pulses.     Heart sounds: Normal heart sounds. No murmur heard. Pulmonary:     Effort: Pulmonary effort is normal. No respiratory distress.     Breath sounds: Normal breath sounds. No stridor. No wheezing, rhonchi or rales.  Chest:     Chest wall: No tenderness.  Abdominal:     General: Abdomen is flat. Bowel sounds are normal. There is no distension.     Palpations: Abdomen is soft.  Musculoskeletal:     Cervical back: Neck supple. No rigidity or tenderness.  Lymphadenopathy:     Cervical: No cervical adenopathy.  Neurological:     General: No focal deficit present.     Mental Status: She is alert and oriented to person, place, and time. Mental status is at baseline.     Cranial Nerves: No cranial nerve deficit.     Sensory: No sensory deficit.     Motor: No weakness.     Deep Tendon Reflexes: Reflexes normal.           Assessment & Plan:  Colon cancer screening - Plan: Ambulatory referral to Gastroenterology  Screening cholesterol level - Plan: CBC with Differential/Platelet, COMPLETE METABOLIC PANEL WITH GFR, Lipid panel  Attention deficit disorder (ADD) without hyperactivity  Gastroesophageal reflux disease, unspecified whether esophagitis present I am concerned that the patient has an elevated blood pressure.  I have asked her to check her blood pressure at home several times and to send me the numbers for me to review.  If her blood pressure is elevated I would recommend stopping the Adderall that she takes for ADD and potentially treating her blood pressure if necessary.  She has refractory GERD.  I will add Pepcid 40 mg a day to Protonix and I recommended that she elevate the head of her bed 2 inches.  Will consult GI for colonoscopy may elect to do an EGD at the same time given her refractory GERD.  Check CBC CMP and fasting lipid panel.  Mammogram and Pap smear are  deferred to her gynecologist

## 2022-07-24 ENCOUNTER — Encounter: Payer: Self-pay | Admitting: Family Medicine

## 2022-07-24 LAB — CBC WITH DIFFERENTIAL/PLATELET
Absolute Monocytes: 552 cells/uL (ref 200–950)
Basophils Absolute: 101 cells/uL (ref 0–200)
Basophils Relative: 1.1 %
Eosinophils Absolute: 488 cells/uL (ref 15–500)
Eosinophils Relative: 5.3 %
HCT: 38.5 % (ref 35.0–45.0)
Hemoglobin: 13.4 g/dL (ref 11.7–15.5)
Lymphs Abs: 2815 cells/uL (ref 850–3900)
MCH: 28.3 pg (ref 27.0–33.0)
MCHC: 34.8 g/dL (ref 32.0–36.0)
MCV: 81.4 fL (ref 80.0–100.0)
MPV: 11 fL (ref 7.5–12.5)
Monocytes Relative: 6 %
Neutro Abs: 5244 cells/uL (ref 1500–7800)
Neutrophils Relative %: 57 %
Platelets: 279 10*3/uL (ref 140–400)
RBC: 4.73 10*6/uL (ref 3.80–5.10)
RDW: 12.7 % (ref 11.0–15.0)
Total Lymphocyte: 30.6 %
WBC: 9.2 10*3/uL (ref 3.8–10.8)

## 2022-07-24 LAB — COMPLETE METABOLIC PANEL WITH GFR
AG Ratio: 1.8 (calc) (ref 1.0–2.5)
ALT: 17 U/L (ref 6–29)
AST: 19 U/L (ref 10–35)
Albumin: 4.5 g/dL (ref 3.6–5.1)
Alkaline phosphatase (APISO): 75 U/L (ref 37–153)
BUN/Creatinine Ratio: 14 (calc) (ref 6–22)
BUN: 17 mg/dL (ref 7–25)
CO2: 29 mmol/L (ref 20–32)
Calcium: 9.3 mg/dL (ref 8.6–10.4)
Chloride: 103 mmol/L (ref 98–110)
Creat: 1.21 mg/dL — ABNORMAL HIGH (ref 0.50–1.03)
Globulin: 2.5 g/dL (calc) (ref 1.9–3.7)
Glucose, Bld: 96 mg/dL (ref 65–99)
Potassium: 4.1 mmol/L (ref 3.5–5.3)
Sodium: 141 mmol/L (ref 135–146)
Total Bilirubin: 0.4 mg/dL (ref 0.2–1.2)
Total Protein: 7 g/dL (ref 6.1–8.1)
eGFR: 52 mL/min/{1.73_m2} — ABNORMAL LOW (ref >=60)

## 2022-07-24 LAB — LIPID PANEL
Cholesterol: 272 mg/dL — ABNORMAL HIGH (ref <200)
HDL: 56 mg/dL (ref >=50)
LDL Cholesterol (Calc): 179 mg/dL (calc) — ABNORMAL HIGH
Non-HDL Cholesterol (Calc): 216 mg/dL (calc) — ABNORMAL HIGH (ref <130)
Total CHOL/HDL Ratio: 4.9 (calc) (ref <5.0)
Triglycerides: 209 mg/dL — ABNORMAL HIGH (ref <150)

## 2022-08-17 ENCOUNTER — Telehealth: Payer: Self-pay

## 2022-08-17 NOTE — Telephone Encounter (Signed)
Prescription Request  08/17/2022  Is this a "Controlled Substance" medicine? Yes  LOV: 07/23/22  What is the name of the medication or equipment? zolpidem (AMBIEN CR) 6.25 MG CR tablet [id number]  Have you contacted your pharmacy to request a refill? Yes   Which pharmacy would you like this sent to?  [id number]Phone: [phone]: [phone]   Patient notified that their request is being sent to the clinical staff for review and that they should receive a response within 2 business days.   Please advise at Mobile [phone](mobile)

## 2022-08-18 ENCOUNTER — Other Ambulatory Visit: Payer: Self-pay | Admitting: Family Medicine

## 2022-08-18 MED ORDER — ZOLPIDEM TARTRATE ER 6.25 MG PO TBCR: 6.2500 mg | EXTENDED_RELEASE_TABLET | Freq: Every evening | ORAL | 3 refills | Status: DC | PRN

## 2022-08-22 ENCOUNTER — Other Ambulatory Visit: Payer: Self-pay | Admitting: Family Medicine

## 2022-08-22 DIAGNOSIS — F988 Other specified behavioral and emotional disorders with onset usually occurring in childhood and adolescence: Secondary | ICD-10-CM

## 2022-08-25 MED ORDER — AMPHETAMINE-DEXTROAMPHETAMINE 30 MG PO TABS: 30.0000 mg | ORAL_TABLET | Freq: Two times a day (BID) | ORAL | 0 refills | Status: DC

## 2022-09-28 ENCOUNTER — Other Ambulatory Visit: Payer: Self-pay | Admitting: Family Medicine

## 2022-09-28 ENCOUNTER — Encounter: Payer: Self-pay | Admitting: Family Medicine

## 2022-09-29 NOTE — Telephone Encounter (Signed)
Last OV 07/23/22, within protocol.  Requested Prescriptions  Pending Prescriptions Disp Refills   pantoprazole (PROTONIX) 40 MG tablet [Pharmacy Med Name: PANTOPRAZOLE SOD DR 40 MG TAB] 90 tablet 0    Sig: TAKE 1 TABLET BY MOUTH EVERY DAY     Gastroenterology: Proton Pump Inhibitors Failed - 09/28/2022  2:34 AM      Failed - Valid encounter within last 12 months    Recent Outpatient Visits           1 year ago Acute bacterial sinusitis   [place] Susy Frizzle, MD   1 year ago Moderate persistent asthma with acute exacerbation   [place] Dennard Schaumann, Cammie Mcgee, MD   2 years ago Esophageal dysphagia   [place] Dennard Schaumann, Cammie Mcgee, MD   3 years ago Right sided sciatica   [place] Denn[facility] Schaumann, Cammie Mcgee, MD   4 years ago Cervical radiculopathy   [place] Pickard, Cammie Mcgee, MD               montelukast (SINGULAIR) 10 MG tablet [Pharmacy Med Name: MONTELUKAST SOD 10 MG TABLET] 90 tablet 0    Sig: TAKE 1 TABLET BY MOUTH EVERYDAY AT BEDTIME     Pulmonology:  Leukotriene Inhibitors Failed - 09/28/2022  2:34 AM      Failed - Valid encounter within last 12 months    Recent Outpatient Visits           1 year ago Acute bacterial sinusitis   [place] Susy Frizzle, MD   1 year ago Moderate persistent asthma with acute exacerbation   [place] Susy Frizzle, MD   2 years ago Esophageal dysphagia   [place], Warren T, MD   3 years ago Right sided sciatica   [place] [facility] Schaumann, Cammie Mcgee, MD   4 years ago Cervical radiculopathy   [place] Pickard, Cammie Mcgee, MD

## 2022-10-02 ENCOUNTER — Other Ambulatory Visit: Payer: Self-pay | Admitting: Family Medicine

## 2022-10-02 DIAGNOSIS — F988 Other specified behavioral and emotional disorders with onset usually occurring in childhood and adolescence: Secondary | ICD-10-CM

## 2022-10-06 MED ORDER — AMPHETAMINE-DEXTROAMPHETAMINE 30 MG PO TABS: 30.0000 mg | ORAL_TABLET | Freq: Two times a day (BID) | ORAL | 0 refills | Status: DC

## 2022-10-28 ENCOUNTER — Other Ambulatory Visit: Payer: Self-pay | Admitting: Family Medicine

## 2022-11-06 ENCOUNTER — Other Ambulatory Visit: Payer: Self-pay | Admitting: Family Medicine

## 2022-11-06 ENCOUNTER — Encounter: Payer: Self-pay | Admitting: Family Medicine

## 2022-11-06 DIAGNOSIS — F988 Other specified behavioral and emotional disorders with onset usually occurring in childhood and adolescence: Secondary | ICD-10-CM

## 2022-11-06 MED ORDER — AMPHETAMINE-DEXTROAMPHETAMINE 30 MG PO TABS: 30.0000 mg | ORAL_TABLET | Freq: Two times a day (BID) | ORAL | 0 refills | Status: DC

## 2022-11-16 ENCOUNTER — Other Ambulatory Visit: Payer: Self-pay | Admitting: Family Medicine

## 2022-11-16 DIAGNOSIS — Z1231 Encounter for screening mammogram for malignant neoplasm of breast: Secondary | ICD-10-CM

## 2022-11-25 ENCOUNTER — Ambulatory Visit
Admission: RE | Admit: 2022-11-25 | Discharge: 2022-11-25 | Disposition: A | Discharge status: Home / Self Care | Payer: 59 | Source: Ambulatory Visit | Attending: Family Medicine | Admitting: Family Medicine

## 2022-11-25 DIAGNOSIS — Z1231 Encounter for screening mammogram for malignant neoplasm of breast: Secondary | ICD-10-CM

## 2022-12-11 ENCOUNTER — Telehealth: Payer: Self-pay | Admitting: Family Medicine

## 2022-12-11 NOTE — Telephone Encounter (Signed)
Prescription Request  12/11/2022  LOV: 07/23/2022  What is the name of the medication or equipment?   amphetamine-dextroamphetamine (ADDERALL) 30 MG tablet  **Last pill taken this morning.   Have you contacted your pharmacy to request a refill? Yes   Which pharmacy would you like this sent to?  [id number] [place], [place] - [address] [address] [place] [address] Phone: [phone] Fax: [phone]    Patient notified that their request is being sent to the clinical staff for review and that they should receive a response within 2 business days.   Please advise patient when refill sent.

## 2022-12-14 ENCOUNTER — Other Ambulatory Visit: Payer: Self-pay | Admitting: Family Medicine

## 2022-12-14 DIAGNOSIS — F988 Other specified behavioral and emotional disorders with onset usually occurring in childhood and adolescence: Secondary | ICD-10-CM

## 2022-12-14 MED ORDER — AMPHETAMINE-DEXTROAMPHETAMINE 30 MG PO TABS: 30.0000 mg | ORAL_TABLET | Freq: Two times a day (BID) | ORAL | 0 refills | Status: DC

## 2022-12-28 ENCOUNTER — Other Ambulatory Visit: Payer: Self-pay | Admitting: Family Medicine

## 2023-01-14 ENCOUNTER — Other Ambulatory Visit: Payer: Self-pay | Admitting: Family Medicine

## 2023-01-14 DIAGNOSIS — F988 Other specified behavioral and emotional disorders with onset usually occurring in childhood and adolescence: Secondary | ICD-10-CM

## 2023-01-15 ENCOUNTER — Other Ambulatory Visit: Payer: Self-pay | Admitting: Family Medicine

## 2023-01-15 NOTE — Telephone Encounter (Signed)
Prescription Request  01/15/2023  LOV: 07/23/2022  What is the name of the medication or equipment?   albuterol (VENTOLIN HFA) 108 (90 Base) MCG/ACT inhaler   Have you contacted your pharmacy to request a refill? Yes   Which pharmacy would you like this sent to?  [id number] [place], [place] - [address] [facility] [address] [place] [address] Phone: [phone] Fax: [phone]    Patient notified that their request is being sent to the clinical staff for review and that they should receive a response within 2 business days.   Please advise pharmacist.

## 2023-01-15 NOTE — Telephone Encounter (Signed)
Requested medications are due for refill today.  unsure  Requested medications are on the active medications list.  yes  Last refill. 07/02/2021 8.5 1 rf  Future visit scheduled.   no  Notes to clinic.  Rx last refilled 07/02/2021    Requested Prescriptions  Pending Prescriptions Disp Refills   albuterol (VENTOLIN HFA) 108 (90 Base) MCG/ACT inhaler 8.5 each 1     Pulmonology:  Beta Agonists 2 Failed - 01/15/2023  2:43 PM      Failed - Last BP in normal range    BP Readings from Last 1 Encounters:  07/23/22 (!) 140/82         Failed - Valid encounter within last 12 months    Recent Outpatient Visits           1 year ago Acute bacterial sinusitis   [facility] Donita Brooks, MD   2 years ago Moderate persistent asthma with acute exacerbation   [facility] Donita Brooks, MD   2 years ago Esophageal dysphagia   [facility] Donita Brooks, MD   4 years ago Right sided sciatica   [facility] Tanya Nones, Priscille Heidelberg, MD   4 years ago Cervical radiculopathy   [facility] Pickard, Priscille Heidelberg, MD              Passed - Last Heart Rate in normal range    Pulse Readings from Last 1 Encounters:  07/23/22 92

## 2023-01-19 ENCOUNTER — Other Ambulatory Visit: Payer: Self-pay | Admitting: Family Medicine

## 2023-01-19 DIAGNOSIS — F988 Other specified behavioral and emotional disorders with onset usually occurring in childhood and adolescence: Secondary | ICD-10-CM

## 2023-01-19 MED ORDER — AMPHETAMINE-DEXTROAMPHETAMINE 30 MG PO TABS: 30.0000 mg | ORAL_TABLET | Freq: Two times a day (BID) | ORAL | 0 refills | Status: DC

## 2023-02-07 ENCOUNTER — Other Ambulatory Visit: Payer: Self-pay | Admitting: Family Medicine

## 2023-02-08 NOTE — Telephone Encounter (Signed)
Last OV 07/23/22.  Requested Prescriptions  Pending Prescriptions Disp Refills   fluticasone (FLONASE) 50 MCG/ACT nasal spray [Pharmacy Med Name: FLUTICASONE PROP 50 MCG SPRAY] 48 mL 0    Sig: SPRAY 2 SPRAYS INTO EACH NOSTRIL EVERY DAY     Ear, Nose, and Throat: Nasal Preparations - Corticosteroids Failed - 02/07/2023  9:31 AM      Failed - Valid encounter within last 12 months    Recent Outpatient Visits           1 year ago Acute bacterial sinusitis   [facility] Donita Brooks, MD   2 years ago Moderate persistent asthma with acute exacerbation   [facility] Donita Brooks, MD   2 years ago Esophageal dysphagia   [facility] Donita Brooks, MD   4 years ago Right sided sciatica   [facility] Pickard, Priscille Heidelberg, MD   4 years ago Cervical radiculopathy   [facility] Pickard, Priscille Heidelberg, MD

## 2023-02-12 ENCOUNTER — Other Ambulatory Visit: Payer: Self-pay | Admitting: Family Medicine

## 2023-02-12 ENCOUNTER — Encounter: Payer: Self-pay | Admitting: Family Medicine

## 2023-02-12 MED ORDER — ZOLPIDEM TARTRATE ER 6.25 MG PO TBCR: 6.2500 mg | EXTENDED_RELEASE_TABLET | Freq: Every evening | ORAL | 3 refills | Status: DC | PRN

## 2023-02-20 ENCOUNTER — Other Ambulatory Visit: Payer: Self-pay | Admitting: Family Medicine

## 2023-02-20 DIAGNOSIS — F988 Other specified behavioral and emotional disorders with onset usually occurring in childhood and adolescence: Secondary | ICD-10-CM

## 2023-02-22 MED ORDER — AMPHETAMINE-DEXTROAMPHETAMINE 30 MG PO TABS: 30.0000 mg | ORAL_TABLET | Freq: Two times a day (BID) | ORAL | 0 refills | Status: DC

## 2023-03-26 ENCOUNTER — Other Ambulatory Visit: Payer: Self-pay | Admitting: Family Medicine

## 2023-03-26 DIAGNOSIS — F988 Other specified behavioral and emotional disorders with onset usually occurring in childhood and adolescence: Secondary | ICD-10-CM

## 2023-03-30 ENCOUNTER — Encounter: Payer: Self-pay | Admitting: Family Medicine

## 2023-03-30 ENCOUNTER — Other Ambulatory Visit: Payer: Self-pay | Admitting: Family Medicine

## 2023-03-30 DIAGNOSIS — F988 Other specified behavioral and emotional disorders with onset usually occurring in childhood and adolescence: Secondary | ICD-10-CM

## 2023-03-30 MED ORDER — AMPHETAMINE-DEXTROAMPHETAMINE 30 MG PO TABS: 30.0000 mg | ORAL_TABLET | Freq: Two times a day (BID) | ORAL | 0 refills | Status: DC

## 2023-04-19 ENCOUNTER — Encounter: Payer: Self-pay | Admitting: Family Medicine

## 2023-04-20 ENCOUNTER — Other Ambulatory Visit: Payer: Self-pay

## 2023-04-20 DIAGNOSIS — J4541 Moderate persistent asthma with (acute) exacerbation: Secondary | ICD-10-CM

## 2023-04-20 MED ORDER — ALBUTEROL SULFATE HFA 108 (90 BASE) MCG/ACT IN AERS
2.0000 | INHALATION_SPRAY | Freq: Four times a day (QID) | RESPIRATORY_TRACT | 1 refills | Status: DC | PRN
Start: 2023-04-20 — End: 2023-08-06

## 2023-04-23 ENCOUNTER — Other Ambulatory Visit: Payer: Self-pay | Admitting: Family Medicine

## 2023-04-23 NOTE — Telephone Encounter (Signed)
Requested Prescriptions  Pending Prescriptions Disp Refills   famotidine (PEPCID) 40 MG tablet [Pharmacy Med Name: FAMOTIDINE 40 MG TABLET] 90 tablet 1    Sig: TAKE 1 TABLET BY MOUTH EVERY DAY     Gastroenterology:  H2 Antagonists Failed - 04/23/2023  1:44 AM      Failed - Valid encounter within last 12 months    Recent Outpatient Visits           1 year ago Acute bacterial sinusitis   [facility] Donita Brooks, MD   2 years ago Moderate persistent asthma with acute exacerbation   [facility] Medicine Donita Brooks, MD   2 years ago Esophageal dysphagia   [facility] Donita Brooks, MD   4 years ago Right sided sciatica   [facility] Medicine Pickard, Priscille Heidelberg, MD   4 years ago Cervical radiculopathy   [facility] Medicine Pickard, Priscille Heidelberg, MD

## 2023-04-26 ENCOUNTER — Telehealth: Payer: 59 | Admitting: Family Medicine

## 2023-04-26 DIAGNOSIS — J208 Acute bronchitis due to other specified organisms: Secondary | ICD-10-CM | POA: Diagnosis not present

## 2023-04-26 DIAGNOSIS — B9689 Other specified bacterial agents as the cause of diseases classified elsewhere: Secondary | ICD-10-CM

## 2023-04-26 MED ORDER — AMOXICILLIN-POT CLAVULANATE 875-125 MG PO TABS
1.0000 | ORAL_TABLET | Freq: Two times a day (BID) | ORAL | 0 refills | Status: AC
Start: 2023-04-26 — End: 2023-05-03

## 2023-04-26 MED ORDER — PROMETHAZINE-DM 6.25-15 MG/5ML PO SYRP
5.0000 mL | ORAL_SOLUTION | Freq: Four times a day (QID) | ORAL | 0 refills | Status: AC | PRN
Start: 2023-04-26 — End: ?

## 2023-04-26 MED ORDER — BENZONATATE 100 MG PO CAPS
100.0000 mg | ORAL_CAPSULE | Freq: Three times a day (TID) | ORAL | 0 refills | Status: AC | PRN
Start: 2023-04-26 — End: ?

## 2023-04-26 NOTE — Patient Instructions (Signed)
Kathryn Murray, thank you for joining Freddy Finner, NP for today's virtual visit.  While this provider is not your primary care provider (PCP), if your PCP is located in our provider database this encounter information will be shared with them immediately following your visit.   A [facility] MyChart account gives you access to today's visit and all your visits, tests, and labs performed at [facility] " click here if you don't have a [name] account or go to [email or web address]  Consent: (Patient) Kathryn Murray provided verbal consent for this virtual visit at the beginning of the encounter.  Current Medications:  Current Outpatient Medications:    amoxicillin-clavulanate (AUGMENTIN) 875-125 MG tablet, Take 1 tablet by mouth 2 (two) times daily for 7 days., Disp: 14 tablet, Rfl: 0   benzonatate (TESSALON) 100 MG capsule, Take 1 capsule (100 mg total) by mouth 3 (three) times daily as needed for cough., Disp: 30 capsule, Rfl: 0   promethazine-dextromethorphan (PROMETHAZINE-DM) 6.25-15 MG/5ML syrup, Take 5 mLs by mouth 4 (four) times daily as needed for cough., Disp: 118 mL, Rfl: 0   albuterol (VENTOLIN HFA) 108 (90 Base) MCG/ACT inhaler, Inhale 2 puffs into the lungs every 6 (six) hours as needed for wheezing or shortness of breath., Disp: 8.5 each, Rfl: 1   amphetamine-dextroamphetamine (ADDERALL) 30 MG tablet, Take 1 tablet by mouth 2 (two) times daily., Disp: 60 tablet, Rfl: 0   Cholecalciferol (VITAMIN D3) 2000 units TABS, Take 1 tablet by mouth daily., Disp: , Rfl:    ergocalciferol (VITAMIN D2) 1.25 MG (50000 UT) capsule, ergocalciferol (vitamin D2) 1,250 mcg (50,000 unit) capsule  TAKE 1 CAPSULE EVERY WEEK BY ORAL ROUTE., Disp: , Rfl:    famotidine (PEPCID) 40 MG tablet, TAKE 1 TABLET BY MOUTH EVERY DAY, Disp: 90 tablet, Rfl: 0   fluticasone (FLONASE) 50 MCG/ACT nasal spray, SPRAY 2 SPRAYS INTO EACH NOSTRIL EVERY DAY, Disp: 48 mL, Rfl: 0   montelukast  (SINGULAIR) 10 MG tablet, TAKE 1 TABLET BY MOUTH EVERYDAY AT BEDTIME, Disp: 90 tablet, Rfl: 1   pantoprazole (PROTONIX) 40 MG tablet, TAKE 1 TABLET BY MOUTH EVERY DAY, Disp: 90 tablet, Rfl: 1   SYMBICORT 160-4.5 MCG/ACT inhaler, INHALE 2 PUFFS INTO THE LUNGS TWICE A DAY, Disp: 10.2 each, Rfl: 3   zolpidem (AMBIEN CR) 6.25 MG CR tablet, Take 1 tablet (6.25 mg total) by mouth at bedtime as needed., Disp: 30 tablet, Rfl: 3   Medications ordered in this encounter:  Meds ordered this encounter  Medications   amoxicillin-clavulanate (AUGMENTIN) 875-125 MG tablet    Sig: Take 1 tablet by mouth 2 (two) times daily for 7 days.    Dispense:  14 tablet    Refill:  0    Order Specific Question:   Supervising Provider    Answer:   Merrilee Jansky [[id number]]   benzonatate (TESSALON) 100 MG capsule    Sig: Take 1 capsule (100 mg total) by mouth 3 (three) times daily as needed for cough.    Dispense:  30 capsule    Refill:  0    Order Specific Question:   Supervising Provider    Answer:   Merrilee Jansky [id number]   promethazine-dextromethorphan (PROMETHAZINE-DM) 6.25-15 MG/5ML syrup    Sig: Take 5 mLs by mouth 4 (four) times daily as needed for cough.    Dispense:  118 mL    Refill:  0    Order Specific Question:   Supervising Provider  AnswerMerrilee Jansky [[id number]]     *If you need refills on other medications prior to your next appointment, please contact your pharmacy*  Follow-Up: Call back or seek an in-person evaluation if the symptoms worsen or if the condition fails to improve as anticipated.  [facility] [phone]  Other Instructions  - Take meds as prescribed - Rest voice - Use a cool mist humidifier especially during the winter months when heat dries out the air. - Use saline nose sprays frequently to help soothe nasal passages if they are drying out. - Stay hydrated by drinking plenty of fluids - Keep thermostat turn down low to prevent drying out  which can cause a dry cough. - For fever or aches or pains- take tylenol or ibuprofen as directed on bottle             * for fevers greater than 101 orally you may alternate ibuprofen and tylenol every 3 hours.  If you do not improve you will need a follow up visit in person.                  If you have been instructed to have an in-person evaluation today at a local Urgent Care facility, please use the link below. It will take you to a list of all of our available [facility], including address, phone number and hours of operation. Please do not delay care.  [facility]  If you or a family member do not have a primary care provider, use the link below to schedule a visit and establish care. When you choose a [facility] primary care physician or advanced practice provider, you gain a long-term partner in health. Find a Primary Care Provider  Learn more about [facility]'s in-office and virtual care options: [facility] - Get Care Now

## 2023-04-26 NOTE — Progress Notes (Signed)
Virtual Visit Consent   SPIRIT GOODIE, you are scheduled for a virtual visit with a [facility] provider today. Just as with appointments in the office, your consent must be obtained to participate. Your consent will be active for this visit and any virtual visit you may have with one of our providers in the next 365 days. If you have a MyChart account, a copy of this consent can be sent to you electronically.  As this is a virtual visit, video technology does not allow for your provider to perform a traditional examination. This may limit your provider's ability to fully assess your condition. If your provider identifies any concerns that need to be evaluated in person or the need to arrange testing (such as labs, EKG, etc.), we will make arrangements to do so. Although advances in technology are sophisticated, we cannot ensure that it will always work on either your end or our end. If the connection with a video visit is poor, the visit may have to be switched to a telephone visit. With either a video or telephone visit, we are not always able to ensure that we have a secure connection.  By engaging in this virtual visit, you consent to the provision of healthcare and authorize for your insurance to be billed (if applicable) for the services provided during this visit. Depending on your insurance coverage, you may receive a charge related to this service.  I need to obtain your verbal consent now. Are you willing to proceed with your visit today? Kathryn Murray has provided verbal consent on 04/26/2023 for a virtual visit (video or telephone). Freddy Finner, NP  Date: 04/26/2023 12:19 PM  Virtual Visit via Video Note   I, Freddy Finner, connected with  Kathryn Murray  ([id number], Dec 07, 1960) on 04/26/23 at 12:30 PM EDT by a video-enabled telemedicine application and verified that I am speaking with the correct person using two identifiers.  Location: Patient: Virtual Visit Location Patient:  Home Provider: Virtual Visit Location Provider: Home Office   I discussed the limitations of evaluation and management by telemedicine and the availability of in person appointments. The patient expressed understanding and agreed to proceed.    History of Present Illness: Kathryn Murray is a 62 y.o. who identifies as a female who was assigned female at birth, and is being seen today for cough  Onset was 04/16/2023 started with feeling bad and tired cough, check congestion, nasal congestion,  Associated symptoms are headache, chest congestion, had a fever but it is gone now in last 5-7 days Modifying factors are dayquil and using inhaler some relief with it Denies chest pain, shortness of breath, fevers, chills Post covid 2021- she has had trouble with asthma and bronchitis   Exposure to sick contacts- known grandkids and kids sick  COVID test: neg  Vaccines: no recent ones    Problems:  Patient Active Problem List   Diagnosis Date Noted   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   Other intervertebral disc degeneration, lumbar region 01/10/2019   Vitamin D deficiency 01/03/2016   ADD (attention deficit disorder) 12/25/2013    Allergies:  Allergies  Allergen Reactions   Sulphadimidine [Sulfamethazine] Rash   Medications:  Current Outpatient Medications:    albuterol (VENTOLIN HFA) 108 (90 Base) MCG/ACT inhaler, Inhale 2 puffs into the lungs every 6 (six) hours as needed for wheezing or shortness of breath., Disp: 8.5 each, Rfl: 1   amphetamine-dextroamphetamine (ADDERALL) 30 MG tablet, Take 1  tablet by mouth 2 (two) times daily., Disp: 60 tablet, Rfl: 0   Cholecalciferol (VITAMIN D3) 2000 units TABS, Take 1 tablet by mouth daily., Disp: , Rfl:    ergocalciferol (VITAMIN D2) 1.25 MG (50000 UT) capsule, ergocalciferol (vitamin D2) 1,250 mcg (50,000 unit) capsule  TAKE 1 CAPSULE EVERY WEEK BY ORAL ROUTE., Disp: , Rfl:    famotidine (PEPCID) 40 MG tablet, TAKE 1 TABLET  BY MOUTH EVERY DAY, Disp: 90 tablet, Rfl: 0   fluticasone (FLONASE) 50 MCG/ACT nasal spray, SPRAY 2 SPRAYS INTO EACH NOSTRIL EVERY DAY, Disp: 48 mL, Rfl: 0   montelukast (SINGULAIR) 10 MG tablet, TAKE 1 TABLET BY MOUTH EVERYDAY AT BEDTIME, Disp: 90 tablet, Rfl: 1   pantoprazole (PROTONIX) 40 MG tablet, TAKE 1 TABLET BY MOUTH EVERY DAY, Disp: 90 tablet, Rfl: 1   SYMBICORT 160-4.5 MCG/ACT inhaler, INHALE 2 PUFFS INTO THE LUNGS TWICE A DAY, Disp: 10.2 each, Rfl: 3   zolpidem (AMBIEN CR) 6.25 MG CR tablet, Take 1 tablet (6.25 mg total) by mouth at bedtime as needed., Disp: 30 tablet, Rfl: 3  Observations/Objective: Patient is well-developed, well-nourished in no acute distress.  Resting comfortably  at home.  Head is normocephalic, atraumatic.  No labored breathing.  Speech is clear and coherent with logical content.  Patient is alert and oriented at baseline.  Cough   Assessment and Plan:    1. Acute bacterial bronchitis  - amoxicillin-clavulanate (AUGMENTIN) 875-125 MG tablet; Take 1 tablet by mouth 2 (two) times daily for 7 days.  Dispense: 14 tablet; Refill: 0 - benzonatate (TESSALON) 100 MG capsule; Take 1 capsule (100 mg total) by mouth 3 (three) times daily as needed for cough.  Dispense: 30 capsule; Refill: 0 - promethazine-dextromethorphan (PROMETHAZINE-DM) 6.25-15 MG/5ML syrup; Take 5 mLs by mouth 4 (four) times daily as needed for cough.  Dispense: 118 mL; Refill: 0  - Take meds as prescribed - Rest voice - Use a cool mist humidifier especially during the winter months when heat dries out the air. - Use saline nose sprays frequently to help soothe nasal passages if they are drying out. - Stay hydrated by drinking plenty of fluids - Keep thermostat turn down low to prevent drying out which can cause a dry cough. - For fever or aches or pains- take tylenol or ibuprofen as directed on bottle             * for fevers greater than 101 orally you may alternate ibuprofen and tylenol  every 3 hours.  If you do not improve you will need a follow up visit in person.                  Reviewed side effects, risks and benefits of medication.    Patient acknowledged agreement and understanding of the plan.   Past Medical, Surgical, Social History, Allergies, and Medications have been Reviewed.   Follow Up Instructions: I discussed the assessment and treatment plan with the patient. The patient was provided an opportunity to ask questions and all were answered. The patient agreed with the plan and demonstrated an understanding of the instructions.  A copy of instructions were sent to the patient via MyChart unless otherwise noted below.   The patient was advised to call back or seek an in-person evaluation if the symptoms worsen or if the condition fails to improve as anticipated.    Freddy Finner, NP

## 2023-05-04 ENCOUNTER — Other Ambulatory Visit: Payer: Self-pay | Admitting: Family Medicine

## 2023-05-04 DIAGNOSIS — F988 Other specified behavioral and emotional disorders with onset usually occurring in childhood and adolescence: Secondary | ICD-10-CM

## 2023-05-07 ENCOUNTER — Encounter: Payer: Self-pay | Admitting: Family Medicine

## 2023-05-07 ENCOUNTER — Other Ambulatory Visit: Payer: Self-pay | Admitting: Family Medicine

## 2023-05-07 DIAGNOSIS — F988 Other specified behavioral and emotional disorders with onset usually occurring in childhood and adolescence: Secondary | ICD-10-CM

## 2023-05-07 MED ORDER — AMPHETAMINE-DEXTROAMPHETAMINE 30 MG PO TABS: 30.0000 mg | ORAL_TABLET | Freq: Two times a day (BID) | ORAL | 0 refills | Status: DC

## 2023-05-10 ENCOUNTER — Other Ambulatory Visit: Payer: Self-pay | Admitting: Family Medicine

## 2023-05-11 NOTE — Telephone Encounter (Signed)
Requested by interface surescripts. Last OV 07/23/22. No future visit . Requested Prescriptions  Pending Prescriptions Disp Refills   fluticasone (FLONASE) 50 MCG/ACT nasal spray [Pharmacy Med Name: FLUTICASONE PROP 50 MCG SPRAY] 48 mL 0    Sig: SPRAY 2 SPRAYS INTO EACH NOSTRIL EVERY DAY     Ear, Nose, and Throat: Nasal Preparations - Corticosteroids Failed - 05/11/2023 10:08 AM      Failed - Valid encounter within last 12 months    Recent Outpatient Visits           1 year ago Acute bacterial sinusitis   [facility] Donita Brooks, MD   2 years ago Moderate persistent asthma with acute exacerbation   [facility] Donita Brooks, MD   2 years ago Esophageal dysphagia   [facility] Donita Brooks, MD   4 years ago Right sided sciatica   [facility] Pickard, Priscille Heidelberg, MD   4 years ago Cervical radiculopathy   [facility] Pickard, Priscille Heidelberg, MD

## 2023-06-01 ENCOUNTER — Other Ambulatory Visit: Payer: Self-pay | Admitting: Family Medicine

## 2023-06-01 DIAGNOSIS — F988 Other specified behavioral and emotional disorders with onset usually occurring in childhood and adolescence: Secondary | ICD-10-CM

## 2023-06-08 ENCOUNTER — Encounter: Payer: Self-pay | Admitting: Family Medicine

## 2023-06-09 ENCOUNTER — Encounter: Payer: Self-pay | Admitting: Family Medicine

## 2023-06-10 ENCOUNTER — Other Ambulatory Visit: Payer: Self-pay | Admitting: Family Medicine

## 2023-06-10 DIAGNOSIS — F988 Other specified behavioral and emotional disorders with onset usually occurring in childhood and adolescence: Secondary | ICD-10-CM

## 2023-06-10 MED ORDER — AMPHETAMINE-DEXTROAMPHETAMINE 30 MG PO TABS: 30.0000 mg | ORAL_TABLET | Freq: Two times a day (BID) | ORAL | 0 refills | Status: DC

## 2023-06-10 MED ORDER — ZOLPIDEM TARTRATE ER 6.25 MG PO TBCR: 6.2500 mg | EXTENDED_RELEASE_TABLET | Freq: Every evening | ORAL | 3 refills | Status: DC | PRN

## 2023-07-01 ENCOUNTER — Other Ambulatory Visit: Payer: Self-pay | Admitting: Family Medicine

## 2023-07-02 NOTE — Telephone Encounter (Signed)
Requested by interface surescripts. Last OV 07/23/22  Requested Prescriptions  Pending Prescriptions Disp Refills   pantoprazole (PROTONIX) 40 MG tablet [Pharmacy Med Name: PANTOPRAZOLE SOD DR 40 MG TAB] 90 tablet 1    Sig: TAKE 1 TABLET BY MOUTH EVERY DAY     Gastroenterology: Proton Pump Inhibitors Failed - 07/02/2023 10:02 AM      Failed - Valid encounter within last 12 months    Recent Outpatient Visits           1 year ago Acute bacterial sinusitis   [facility] Medicine Donita Brooks, MD   2 years ago Moderate persistent asthma with acute exacerbation   [facility] Pickard, Priscille Heidelberg, MD   2 years ago Esophageal dysphagia   [facility] Tanya Nones, Priscille Heidelberg, MD   4 years ago Right sided sciatica   [facility] Tanya Nones, Priscille Heidelberg, MD   4 years ago Cervical radiculopathy   [facility] Medicine Pickard, Priscille Heidelberg, MD               montelukast (SINGULAIR) 10 MG tablet [Pharmacy Med Name: MONTELUKAST SOD 10 MG TABLET] 90 tablet 1    Sig: TAKE 1 TABLET BY MOUTH EVERYDAY AT BEDTIME     Pulmonology:  Leukotriene Inhibitors Failed - 07/02/2023 10:02 AM      Failed - Valid encounter within last 12 months    Recent Outpatient Visits           1 year ago Acute bacterial sinusitis   [facility] Donita Brooks, MD   2 years ago Moderate persistent asthma with acute exacerbation   [facility] Donita Brooks, MD   2 years ago Esophageal dysphagia   [facility] Donita Brooks, MD   4 years ago Right sided sciatica   [facility] Medicine Pickard, Priscille Heidelberg, MD   4 years ago Cervical radiculopathy   [facility] Medicine Pickard, Priscille Heidelberg, MD

## 2023-07-06 ENCOUNTER — Telehealth: Payer: 59 | Admitting: Nurse Practitioner

## 2023-07-06 DIAGNOSIS — R3989 Other symptoms and signs involving the genitourinary system: Secondary | ICD-10-CM

## 2023-07-06 MED ORDER — NITROFURANTOIN MONOHYD MACRO 100 MG PO CAPS
100.0000 mg | ORAL_CAPSULE | Freq: Two times a day (BID) | ORAL | 0 refills | Status: AC
Start: 2023-07-06 — End: 2023-07-11

## 2023-07-06 NOTE — Progress Notes (Signed)
 Virtual Visit Consent   Kathryn Murray, you are scheduled for a virtual visit with a [facility] provider today. Just as with appointments in the office, your consent must be obtained to participate. Your consent will be active for this visit and any virtual visit you may have with one of our providers in the next 365 days. If you have a MyChart account, a copy of this consent can be sent to you electronically.  As this is a virtual visit, video technology does not allow for your provider to perform a traditional examination. This may limit your provider's ability to fully assess your condition. If your provider identifies any concerns that need to be evaluated in person or the need to arrange testing (such as labs, EKG, etc.), we will make arrangements to do so. Although advances in technology are sophisticated, we cannot ensure that it will always work on either your end or our end. If the connection with a video visit is poor, the visit may have to be switched to a telephone visit. With either a video or telephone visit, we are not always able to ensure that we have a secure connection.  By engaging in this virtual visit, you consent to the provision of healthcare and authorize for your insurance to be billed (if applicable) for the services provided during this visit. Depending on your insurance coverage, you may receive a charge related to this service.  I need to obtain your verbal consent now. Are you willing to proceed with your visit today? [name] has provided verbal consent on 07/06/2023 for a virtual visit (video or telephone). Lauraine Kitty, FNP  Date: 07/06/2023 3:50 PM  Virtual Visit via Video Note   I, Lauraine Kitty, connected with  Kathryn Murray  ([id number], [birth year]) on 07/06/23 at  4:00 PM EST by a video-enabled telemedicine application and verified that I am speaking with the correct person using two identifiers.  Location: Patient: Virtual Visit Location Patient:  Home Provider: Virtual Visit Location Provider: Home Office   I discussed the limitations of evaluation and management by telemedicine and the availability of in person appointments. The patient expressed understanding and agreed to proceed.    History of Present Illness: Kathryn Murray is a 62 y.o. 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   Other intervertebral disc degeneration, lumbar region 01/10/2019   Vitamin D  deficiency 01/03/2016   ADD (attention deficit disorder) 12/25/2013    Allergies:  Allergies  Allergen Reactions   Sulphadimidine [Sulfamethazine] Rash   Medications:  Current Outpatient Medications:    albuterol  (VENTOLIN  HFA) 108 (90 Base) MCG/ACT inhaler, Inhale 2 puffs into the lungs every 6 (six) hours as needed for wheezing or shortness of breath., Disp: 8.5 each, Rfl: 1   amphetamine -dextroamphetamine  (ADDERALL) 30 MG tablet, Take 1 tablet by mouth 2 (two) times daily., Disp: 60 tablet, Rfl: 0   benzonatate  (TESSALON ) 100 MG capsule, Take 1 capsule (100 mg total) by mouth 3 (three) times daily as needed for cough., Disp: 30 capsule, Rfl:  0   Cholecalciferol (VITAMIN D3) 2000 units TABS, Take 1 tablet by mouth daily., Disp: , Rfl:    ergocalciferol  (VITAMIN D2) 1.25 MG (50000 UT) capsule, ergocalciferol  (vitamin D2) 1,250 mcg (50,000 unit) capsule  TAKE 1 CAPSULE EVERY WEEK BY ORAL ROUTE., Disp: , Rfl:    famotidine  (PEPCID ) 40 MG tablet, TAKE 1 TABLET BY MOUTH EVERY DAY, Disp: 90 tablet, Rfl: 0   fluticasone  (FLONASE ) 50 MCG/ACT nasal spray,  SPRAY 2 SPRAYS INTO EACH NOSTRIL EVERY DAY, Disp: 48 mL, Rfl: 0   montelukast  (SINGULAIR ) 10 MG tablet, TAKE 1 TABLET BY MOUTH EVERYDAY AT BEDTIME, Disp: 90 tablet, Rfl: 1   pantoprazole  (PROTONIX ) 40 MG tablet, TAKE 1 TABLET BY MOUTH EVERY DAY, Disp: 90 tablet, Rfl: 1   promethazine -dextromethorphan (PROMETHAZINE -DM) 6.25-15 MG/5ML syrup, Take 5 mLs by mouth 4 (four) times daily as needed for cough., Disp: 118 mL, Rfl: 0   SYMBICORT  160-4.5 MCG/ACT inhaler, INHALE 2 PUFFS INTO THE LUNGS TWICE A DAY, Disp: 10.2 each, Rfl: 3   zolpidem  (AMBIEN  CR) 6.25 MG CR tablet, Take 1 tablet (6.25 mg total) by mouth at bedtime as needed., Disp: 30 tablet, Rfl: 3  Observations/Objective: Patient is well-developed, well-nourished in no acute distress.  Resting comfortably  at home.  Head is normocephalic, atraumatic.  No labored breathing.  Speech is clear and coherent with logical content.  Patient is alert and oriented at baseline.    Assessment and Plan:  1. Suspected UTI (Primary)  Push fluids & follow up as discussed as needed  - nitrofurantoin , macrocrystal-monohydrate, (MACROBID ) 100 MG capsule; Take 1 capsule (100 mg total) by mouth 2 (two) times daily for 5 days.  Dispense: 10 capsule; Refill: 0     Follow Up Instructions: I discussed the assessment and treatment plan with the patient. The patient was provided an opportunity to ask questions and all were answered. The patient agreed with the plan and demonstrated an understanding of the instructions.  A copy of instructions were sent to the patient via MyChart unless otherwise noted below.    The patient was advised to call back or seek an in-person evaluation if the symptoms worsen or if the condition fails to improve as anticipated.    Lauraine Kitty, FNP

## 2023-07-15 ENCOUNTER — Other Ambulatory Visit: Payer: Self-pay | Admitting: Family Medicine

## 2023-07-15 DIAGNOSIS — F988 Other specified behavioral and emotional disorders with onset usually occurring in childhood and adolescence: Secondary | ICD-10-CM

## 2023-07-15 MED ORDER — AMPHETAMINE-DEXTROAMPHETAMINE 30 MG PO TABS: 30.0000 mg | ORAL_TABLET | Freq: Two times a day (BID) | ORAL | 0 refills | Status: DC

## 2023-07-21 ENCOUNTER — Other Ambulatory Visit: Payer: Self-pay | Admitting: Family Medicine

## 2023-07-21 NOTE — Telephone Encounter (Signed)
 Last OV 07/23/22 Requested Prescriptions  Pending Prescriptions Disp Refills   famotidine  (PEPCID ) 40 MG tablet [Pharmacy Med Name: FAMOTIDINE  40 MG TABLET] 90 tablet 0    Sig: TAKE 1 TABLET BY MOUTH EVERY DAY     Gastroenterology:  H2 Antagonists Failed - 07/21/2023  3:52 PM      Failed - Valid encounter within last 12 months    Recent Outpatient Visits           1 year ago Acute bacterial sinusitis   [facility] Austine Lefort, MD   2 years ago Moderate persistent asthma with acute exacerbation   [facility] Austine Lefort, MD   2 years ago Esophageal dysphagia   [facility] Austine Lefort, MD   4 years ago Right sided sciatica   [facility] Pickard, Cisco Crest, MD   4 years ago Cervical radiculopathy   [facility] Pickard, Cisco Crest, MD

## 2023-08-06 ENCOUNTER — Other Ambulatory Visit: Payer: Self-pay | Admitting: Family Medicine

## 2023-08-06 DIAGNOSIS — J4541 Moderate persistent asthma with (acute) exacerbation: Secondary | ICD-10-CM

## 2023-08-09 ENCOUNTER — Telehealth: Payer: 59 | Admitting: Nurse Practitioner

## 2023-08-09 DIAGNOSIS — R3989 Other symptoms and signs involving the genitourinary system: Secondary | ICD-10-CM

## 2023-08-09 MED ORDER — CEPHALEXIN 500 MG PO CAPS
500.0000 mg | ORAL_CAPSULE | Freq: Three times a day (TID) | ORAL | 0 refills | Status: AC
Start: 2023-08-09 — End: 2023-08-16

## 2023-08-09 NOTE — Progress Notes (Signed)
E-Visit for Urinary Problems  We are sorry that you are not feeling well.  Here is how we plan to help!  Based on what you shared with me it looks like you most likely have a simple urinary tract infection.  A UTI (Urinary Tract Infection) is a bacterial infection of the bladder.  Most cases of urinary tract infections are simple to treat but a key part of your care is to encourage you to drink plenty of fluids and watch your symptoms carefully.  I have prescribed Keflex 500 mg three times a day for 7 days.  Your symptoms should gradually improve. Call us if the burning in your urine worsens, you develop worsening fever, back pain or pelvic pain or if your symptoms do not resolve after completing the antibiotic.since this is recurring if symptoms do not improve or recur in the next month we would like you to see your primary care for follow up care so you can have urine culture testing   Urinary tract infections can be prevented by drinking plenty of water to keep your body hydrated.  Also be sure when you wipe, wipe from front to back and don't hold it in!  If possible, empty your bladder every 4 hours.  HOME CARE Drink plenty of fluids Compete the full course of the antibiotics even if the symptoms resolve Remember, when you need to go.go. Holding in your urine can increase the likelihood of getting a UTI! GET HELP RIGHT AWAY IF: You cannot urinate You get a high fever Worsening back pain occurs You see blood in your urine You feel sick to your stomach or throw up You feel like you are going to pass out  MAKE SURE YOU  Understand these instructions. Will watch your condition. Will get help right away if you are not doing well or get worse.   Thank you for choosing an e-visit.  Your e-visit answers were reviewed by a board certified advanced clinical practitioner to complete your personal care plan. Depending upon the condition, your plan could have included both over the counter  or prescription medications.  Please review your pharmacy choice. Make sure the pharmacy is open so you can pick up prescription now. If there is a problem, you may contact your provider through [employer] and have the prescription routed to another pharmacy.  Your safety is important to [place]. If you have drug allergies check your prescription carefully.   For the next 24 hours you can use MyChart to ask questions about today's visit, request a non-urgent call back, or ask for a work or school excuse. You will get an email in the next two days asking about your experience. I hope that your e-visit has been valuable and will speed your recovery.   I spent approximately 5 minutes reviewing the patient's history, current symptoms and coordinating their care today.

## 2023-08-20 ENCOUNTER — Other Ambulatory Visit: Payer: Self-pay | Admitting: Family Medicine

## 2023-08-21 ENCOUNTER — Other Ambulatory Visit: Payer: Self-pay | Admitting: Family Medicine

## 2023-08-21 DIAGNOSIS — F988 Other specified behavioral and emotional disorders with onset usually occurring in childhood and adolescence: Secondary | ICD-10-CM

## 2023-08-23 MED ORDER — AMPHETAMINE-DEXTROAMPHETAMINE 30 MG PO TABS
30.0000 mg | ORAL_TABLET | Freq: Two times a day (BID) | ORAL | 0 refills | Status: DC
Start: 2023-08-23 — End: 2023-10-01

## 2023-10-01 ENCOUNTER — Other Ambulatory Visit: Payer: Self-pay | Admitting: Family Medicine

## 2023-10-01 DIAGNOSIS — F988 Other specified behavioral and emotional disorders with onset usually occurring in childhood and adolescence: Secondary | ICD-10-CM

## 2023-10-01 MED ORDER — AMPHETAMINE-DEXTROAMPHETAMINE 30 MG PO TABS
30.0000 mg | ORAL_TABLET | Freq: Two times a day (BID) | ORAL | 0 refills | Status: DC
Start: 2023-10-01 — End: 2023-11-06

## 2023-10-16 ENCOUNTER — Other Ambulatory Visit: Payer: Self-pay | Admitting: Family Medicine

## 2023-10-18 NOTE — Telephone Encounter (Signed)
 Requested medication (s) are due for refill today: Yes  Requested medication (s) are on the active medication list: Yes  Last refill:  07/21/23, #90, 0 refills   Future visit scheduled: No  Notes to clinic:  Unable to refill per protocol, courtesy refill already given, routing for provider approval. Unable to refill per protocol, appointment needed.      Requested Prescriptions  Pending Prescriptions Disp Refills   famotidine (PEPCID) 40 MG tablet [Pharmacy Med Name: FAMOTIDINE 40 MG TABLET] 90 tablet 0    Sig: TAKE 1 TABLET BY MOUTH EVERY DAY     Gastroenterology:  H2 Antagonists Failed - 10/18/2023  1:01 PM      Failed - Valid encounter within last 12 months    Recent Outpatient Visits           1 year ago Colon cancer screening    [facility] Pickard, Cisco Crest, MD

## 2023-10-19 ENCOUNTER — Other Ambulatory Visit: Payer: Self-pay

## 2023-10-19 NOTE — Telephone Encounter (Signed)
 Prescription Request  10/19/2023  LOV: 07/23/22  What is the name of the medication or equipment? zolpidem (AMBIEN CR) 6.25 MG CR tablet [[id number]]   Have you contacted your pharmacy to request a refill? Yes   Which pharmacy would you like this sent to?  [id number] Jonette Nestle, [place] - [address] [place] [address] [place] [address] Phone: [phone] Fax: [phone]    Patient notified that their request is being sent to the clinical staff for review and that they should receive a response within 2 business days.   Please advise at [facility] [phone]

## 2023-10-20 NOTE — Telephone Encounter (Signed)
 Requested medication (s) are due for refill today: yes  Requested medication (s) are on the active medication list: yes  Last refill:  06/10/23 #30 3 RF  Future visit scheduled: no  Notes to clinic:  med not delegated to NT to RF   Requested Prescriptions  Pending Prescriptions Disp Refills   zolpidem (AMBIEN CR) 6.25 MG CR tablet 30 tablet 3    Sig: Take 1 tablet (6.25 mg total) by mouth at bedtime as needed.     Not Delegated - Psychiatry:  Anxiolytics/Hypnotics Failed - 10/20/2023 12:23 PM      Failed - This refill cannot be delegated      Failed - Urine Drug Screen completed in last 360 days      Failed - Valid encounter within last 6 months    Recent Outpatient Visits           1 year ago Colon cancer screening   [facility] Pickard, Cisco Crest, MD

## 2023-10-21 ENCOUNTER — Encounter: Payer: Self-pay | Admitting: Family Medicine

## 2023-10-21 ENCOUNTER — Other Ambulatory Visit: Payer: Self-pay | Admitting: Family Medicine

## 2023-10-21 MED ORDER — ZOLPIDEM TARTRATE ER 6.25 MG PO TBCR: 6.2500 mg | EXTENDED_RELEASE_TABLET | Freq: Every evening | ORAL | 3 refills | Status: DC | PRN

## 2023-11-06 ENCOUNTER — Other Ambulatory Visit: Payer: Self-pay | Admitting: Family Medicine

## 2023-11-06 DIAGNOSIS — F988 Other specified behavioral and emotional disorders with onset usually occurring in childhood and adolescence: Secondary | ICD-10-CM

## 2023-11-08 MED ORDER — AMPHETAMINE-DEXTROAMPHETAMINE 30 MG PO TABS
30.0000 mg | ORAL_TABLET | Freq: Two times a day (BID) | ORAL | 0 refills | Status: DC
Start: 2023-11-08 — End: 2023-12-15

## 2023-11-20 ENCOUNTER — Other Ambulatory Visit: Payer: Self-pay | Admitting: Family Medicine

## 2023-11-23 NOTE — Telephone Encounter (Signed)
 Unable to refill per protocol, courtesy refill already given, OV needed.  Requested Prescriptions  Pending Prescriptions Disp Refills   fluticasone  (FLONASE ) 50 MCG/ACT nasal spray [Pharmacy Med Name: FLUTICASONE  PROP 50 MCG SPRAY] 48 mL 0    Sig: SPRAY 2 SPRAYS INTO EACH NOSTRIL EVERY DAY     Ear, Nose, and Throat: Nasal Preparations - Corticosteroids Failed - 11/23/2023 11:42 AM      Failed - Valid encounter within last 12 months    Recent Outpatient Visits           1 year ago Colon cancer screening   [facility] Pickard, Cisco Crest, MD

## 2023-12-14 ENCOUNTER — Encounter: Payer: Self-pay | Admitting: Family Medicine

## 2023-12-15 ENCOUNTER — Other Ambulatory Visit: Payer: Self-pay

## 2023-12-15 DIAGNOSIS — F988 Other specified behavioral and emotional disorders with onset usually occurring in childhood and adolescence: Secondary | ICD-10-CM

## 2023-12-16 MED ORDER — AMPHETAMINE-DEXTROAMPHETAMINE 30 MG PO TABS: 30.0000 mg | ORAL_TABLET | Freq: Two times a day (BID) | ORAL | 0 refills | Status: DC

## 2024-01-06 ENCOUNTER — Other Ambulatory Visit: Payer: Self-pay | Admitting: Family Medicine

## 2024-01-13 ENCOUNTER — Ambulatory Visit: Admitting: Family Medicine

## 2024-01-18 ENCOUNTER — Encounter: Payer: Self-pay | Admitting: Family Medicine

## 2024-01-18 ENCOUNTER — Other Ambulatory Visit: Payer: Self-pay

## 2024-01-18 DIAGNOSIS — F988 Other specified behavioral and emotional disorders with onset usually occurring in childhood and adolescence: Secondary | ICD-10-CM

## 2024-01-18 MED ORDER — AMPHETAMINE-DEXTROAMPHETAMINE 30 MG PO TABS: 30.0000 mg | ORAL_TABLET | Freq: Two times a day (BID) | ORAL | 0 refills | Status: DC

## 2024-02-01 ENCOUNTER — Ambulatory Visit: Admitting: Family Medicine

## 2024-02-15 ENCOUNTER — Ambulatory Visit: Admitting: Family Medicine

## 2024-02-18 ENCOUNTER — Encounter: Payer: Self-pay | Admitting: Family Medicine

## 2024-02-18 ENCOUNTER — Ambulatory Visit: Admitting: Family Medicine

## 2024-02-18 VITALS — BP 136/76 | HR 102 | Temp 98.4°F | Ht 69.0 in | Wt 167.6 lb

## 2024-02-18 DIAGNOSIS — Z1231 Encounter for screening mammogram for malignant neoplasm of breast: Secondary | ICD-10-CM

## 2024-02-18 DIAGNOSIS — E7800 Pure hypercholesterolemia, unspecified: Secondary | ICD-10-CM

## 2024-02-18 MED ORDER — AMPHETAMINE-DEXTROAMPHETAMINE 20 MG PO TABS: 20.0000 mg | ORAL_TABLET | Freq: Two times a day (BID) | ORAL | 0 refills | Status: DC

## 2024-02-18 NOTE — Progress Notes (Signed)
 Subjective:    Patient ID: Kathryn Murray, female    DOB: [birth year], 63 y.o.   MRN: [id number]  HPI  Patient is currently on Adderall 30 mg twice daily for ADD.  She is no longer working.  She stays at home and cares for her grandchildren.  We discussed the long-term risk of stimulant medication.  I would like to try to wean down on this to avoid any potential cardiovascular risk.  Patient is willing to try that.  She is willing to try to cut down to 20 mg twice a day and see if she can make good.  She does definitely have symptoms of ADD including careless errors, inability to focus, becoming easily distracted, failing to complete task.  However hopefully even at a lower dose she will be able to compensate.  She never got her colonoscopy last year.  I encouraged her to do this.  She is overdue for a mammogram.  At her last visit her cholesterol was extremely high.  I discussed this with her.  She is not currently taking anything for her cholesterol.  We discussed starting a statin however she would be interested in checking a coronary artery calcium score  Past Medical History:  Diagnosis Date   ADD (attention deficit disorder)    GERD (gastroesophageal reflux disease)    HLD (hyperlipidemia)    Wears glasses    reading   Past Surgical History:  Procedure Laterality Date   BREAST EXCISIONAL BIOPSY Right    BREAST SURGERY     right br bx x2   LIGAMENT REPAIR Right 01/03/2014   Procedure: RIGHT LONG REPAIR VS RECONSTRUCTION OF RADIAL COLLATERAL LIGAMENT ;  Surgeon: Donnice DELENA Robinsons, MD;  Location: [facility];  Service: Orthopedics;  Laterality: Right;   TOTAL ABDOMINAL HYSTERECTOMY  2005   Current Outpatient Medications on File Prior to Visit  Medication Sig Dispense Refill   albuterol (VENTOLIN HFA) 108 (90 Base) MCG/ACT inhaler TAKE 2 PUFFS BY MOUTH EVERY 6 HOURS AS NEEDED FOR WHEEZE OR SHORTNESS OF BREATH 6.7 each 1   amphetamine-dextroamphetamine (ADDERALL) 30 MG  tablet Take 1 tablet by mouth 2 (two) times daily. 60 tablet 0   benzonatate (TESSALON) 100 MG capsule Take 1 capsule (100 mg total) by mouth 3 (three) times daily as needed for cough. 30 capsule 0   Cholecalciferol (VITAMIN D3) 2000 units TABS Take 1 tablet by mouth daily.     ergocalciferol (VITAMIN D2) 1.25 MG (50000 UT) capsule ergocalciferol (vitamin D2) 1,250 mcg (50,000 unit) capsule  TAKE 1 CAPSULE EVERY WEEK BY ORAL ROUTE.     famotidine (PEPCID) 40 MG tablet TAKE 1 TABLET BY MOUTH EVERY DAY 90 tablet 0   fluticasone (FLONASE) 50 MCG/ACT nasal spray SPRAY 2 SPRAYS INTO EACH NOSTRIL EVERY DAY 48 mL 0   montelukast (SINGULAIR) 10 MG tablet TAKE 1 TABLET BY MOUTH EVERYDAY AT BEDTIME 90 tablet 1   pantoprazole (PROTONIX) 40 MG tablet TAKE 1 TABLET BY MOUTH EVERY DAY 90 tablet 1   promethazine-dextromethorphan (PROMETHAZINE-DM) 6.25-15 MG/5ML syrup Take 5 mLs by mouth 4 (four) times daily as needed for cough. 118 mL 0   SYMBICORT 160-4.5 MCG/ACT inhaler INHALE 2 PUFFS INTO THE LUNGS TWICE A DAY 10.2 each 3   zolpidem (AMBIEN CR) 6.25 MG CR tablet Take 1 tablet (6.25 mg total) by mouth at bedtime as needed. 30 tablet 3   No current facility-administered medications on file prior to visit.    Allergies  Allergen Reactions   Sulphadimidine [Sulfamethazine] Rash   Social History   Socioeconomic History   Marital status: Married    Spouse name: Not on file   Number of children: Not on file   Years of education: Not on file   Highest education level: Not on file  Occupational History   Not on file  Tobacco Use   Smoking status: Never   Smokeless tobacco: Never  Substance and Sexual Activity   Alcohol use: Yes    Alcohol/week: 0.0 standard drinks of alcohol    Comment: Wine twice a week   Drug use: No   Sexual activity: Not on file  Other Topics Concern   Not on file  Social History Narrative   Not on file   Social Drivers of Health   Financial Resource Strain: Not on file   Food Insecurity: Not on file  Transportation Needs: Not on file  Physical Activity: Not on file  Stress: Not on file  Social Connections: Not on file  Intimate Partner Violence: Not on file      Review of Systems  All other systems reviewed and are negative.      Objective:   Physical Exam Vitals reviewed.  Constitutional:      Appearance: Normal appearance. She is not ill-appearing, toxic-appearing or diaphoretic.  HENT:     Mouth/Throat:     Mouth: Mucous membranes are moist.     Pharynx: Oropharynx is clear. No oropharyngeal exudate or posterior oropharyngeal erythema.  Cardiovascular:     Rate and Rhythm: Normal rate and regular rhythm.     Pulses: Normal pulses.     Heart sounds: Normal heart sounds. No murmur heard. Pulmonary:     Effort: Pulmonary effort is normal. No respiratory distress.     Breath sounds: Normal breath sounds. No stridor. No wheezing, rhonchi or rales.  Chest:     Chest wall: No tenderness.  Abdominal:     General: Abdomen is flat. Bowel sounds are normal. There is no distension.     Palpations: Abdomen is soft.  Musculoskeletal:     Cervical back: Neck supple. No rigidity or tenderness.  Lymphadenopathy:     Cervical: No cervical adenopathy.  Neurological:     General: No focal deficit present.     Mental Status: She is alert and oriented to person, place, and time. Mental status is at baseline.     Cranial Nerves: No cranial nerve deficit.     Sensory: No sensory deficit.     Motor: No weakness.     Deep Tendon Reflexes: Reflexes normal.           Assessment & Plan:  Pure hypercholesterolemia - Plan: CT CARDIAC SCORING (SELF PAY ONLY), CBC with Differential/Platelet, Comprehensive metabolic panel with GFR, Lipid panel  Encounter for screening mammogram for malignant neoplasm of breast - Plan: MM 3D SCREENING MAMMOGRAM BILATERAL BREAST, CANCELED: MM Digital Screening I will schedule the patient for mammogram.  I encouraged the  patient to follow-up regarding her colonoscopy.  I will schedule the patient for a coronary artery calcium score.  Recheck CBC CMP and fasting lipid panel.  Ideally I would like to see her LDL cholesterol less than 899.  I will increase Adderall to 20 mg twice daily and gradually try to wean the patient off Adderall or at least to the lowest effective dose

## 2024-03-17 ENCOUNTER — Ambulatory Visit
Admission: RE | Admit: 2024-03-17 | Discharge: 2024-03-17 | Disposition: A | Discharge status: Home / Self Care | Source: Ambulatory Visit | Attending: Family Medicine | Admitting: Family Medicine

## 2024-03-17 DIAGNOSIS — Z1231 Encounter for screening mammogram for malignant neoplasm of breast: Secondary | ICD-10-CM

## 2024-03-22 ENCOUNTER — Encounter: Payer: Self-pay | Admitting: Family Medicine

## 2024-03-22 ENCOUNTER — Other Ambulatory Visit: Payer: Self-pay | Admitting: Family Medicine

## 2024-03-23 ENCOUNTER — Other Ambulatory Visit: Payer: Self-pay | Admitting: Family Medicine

## 2024-03-23 MED ORDER — AMPHETAMINE-DEXTROAMPHETAMINE 20 MG PO TABS: 20.0000 mg | ORAL_TABLET | Freq: Two times a day (BID) | ORAL | 0 refills | Status: DC

## 2024-03-23 NOTE — Telephone Encounter (Signed)
 Requested Prescriptions  Pending Prescriptions Disp Refills   famotidine  (PEPCID ) 40 MG tablet [Pharmacy Med Name: FAMOTIDINE  40 MG TABLET] 90 tablet 0    Sig: TAKE 1 TABLET BY MOUTH EVERY DAY     Gastroenterology:  H2 Antagonists Passed - 03/23/2024  2:49 PM      Passed - Valid encounter within last 12 months    Recent Outpatient Visits           1 month ago Pure hypercholesterolemia   [facility] Pickard, [place] DASEN, MD   1 year ago Colon cancer screening   [facility] Pickard, [place] DASEN, MD

## 2024-04-06 ENCOUNTER — Ambulatory Visit (HOSPITAL_BASED_OUTPATIENT_CLINIC_OR_DEPARTMENT_OTHER)
Admission: RE | Admit: 2024-04-06 | Discharge: 2024-04-06 | Disposition: A | Discharge status: Home / Self Care | Payer: Self-pay | Source: Ambulatory Visit | Attending: Family Medicine | Admitting: Family Medicine

## 2024-04-06 DIAGNOSIS — E7800 Pure hypercholesterolemia, unspecified: Secondary | ICD-10-CM | POA: Insufficient documentation

## 2024-04-10 ENCOUNTER — Ambulatory Visit: Payer: Self-pay | Admitting: Family Medicine

## 2024-04-19 ENCOUNTER — Other Ambulatory Visit: Payer: Self-pay

## 2024-04-19 ENCOUNTER — Encounter: Payer: Self-pay | Admitting: Family Medicine

## 2024-04-20 ENCOUNTER — Encounter: Payer: Self-pay | Admitting: Family Medicine

## 2024-04-20 MED ORDER — AMPHETAMINE-DEXTROAMPHETAMINE 20 MG PO TABS: 20.0000 mg | ORAL_TABLET | Freq: Two times a day (BID) | ORAL | 0 refills | Status: DC

## 2024-04-21 ENCOUNTER — Other Ambulatory Visit: Payer: Self-pay

## 2024-04-21 MED ORDER — ZOLPIDEM TARTRATE ER 6.25 MG PO TBCR: 6.2500 mg | EXTENDED_RELEASE_TABLET | Freq: Every evening | ORAL | 3 refills | Status: AC | PRN

## 2024-05-29 ENCOUNTER — Encounter: Payer: Self-pay | Admitting: Family Medicine

## 2024-05-29 ENCOUNTER — Other Ambulatory Visit: Payer: Self-pay | Admitting: Family Medicine

## 2024-05-29 MED ORDER — AMPHETAMINE-DEXTROAMPHETAMINE 20 MG PO TABS: 20.0000 mg | ORAL_TABLET | Freq: Two times a day (BID) | ORAL | 0 refills | Status: DC

## 2024-06-18 ENCOUNTER — Other Ambulatory Visit: Payer: Self-pay | Admitting: Family Medicine

## 2024-07-13 ENCOUNTER — Other Ambulatory Visit: Payer: Self-pay

## 2024-07-13 ENCOUNTER — Encounter: Payer: Self-pay | Admitting: Family Medicine

## 2024-07-14 MED ORDER — AMPHETAMINE-DEXTROAMPHETAMINE 20 MG PO TABS: 20.0000 mg | ORAL_TABLET | Freq: Two times a day (BID) | ORAL | 0 refills | Status: AC

## 2024-07-18 ENCOUNTER — Encounter: Payer: Self-pay | Admitting: Family Medicine

## 2024-07-18 ENCOUNTER — Ambulatory Visit: Payer: Self-pay

## 2024-07-18 ENCOUNTER — Ambulatory Visit: Admitting: Family Medicine

## 2024-07-18 VITALS — BP 147/89 | HR 93 | Temp 98.1°F | Ht 69.0 in | Wt 165.6 lb

## 2024-07-18 DIAGNOSIS — J3089 Other allergic rhinitis: Secondary | ICD-10-CM

## 2024-07-18 DIAGNOSIS — R0602 Shortness of breath: Secondary | ICD-10-CM

## 2024-07-18 DIAGNOSIS — J0100 Acute maxillary sinusitis, unspecified: Secondary | ICD-10-CM | POA: Diagnosis not present

## 2024-07-18 MED ORDER — PREDNISONE 20 MG PO TABS: ORAL_TABLET | ORAL | 0 refills | Status: DC

## 2024-07-18 MED ORDER — AMOXICILLIN-POT CLAVULANATE 875-125 MG PO TABS: 1.0000 | ORAL_TABLET | Freq: Two times a day (BID) | ORAL | 0 refills | Status: AC

## 2024-07-18 NOTE — Telephone Encounter (Signed)
 FYI Only or Action Required?: FYI only for provider: appointment scheduled on 07/18/24.  Patient was last seen in primary care on 02/18/2024 by Kathryn Murray DASEN, MD.  Called Nurse Triage reporting Cough.  Symptoms began about a month ago.  Interventions attempted: OTC medications: mucinex  liquid cold and flu; dayquil and Prescription medications: albuterol  Inhaler.  Symptoms are: gradually worsening.  Triage Disposition: See HCP Within 4 Hours (Or PCP Triage)  Patient/caregiver understands and will follow disposition?: Yes    Copied from CRM #[id number]. Topic: Clinical - Red Word Triage >> Jul 18, 2024 10:15 AM Kathryn Murray wrote: [facility] that prompted transfer to Nurse Triage: Wheezing with discolored mucous. Patient has a cough with yellow phlegm, wheezing, congestion.   Reason for Disposition  Wheezing is present  Answer Assessment - Initial Assessment Questions Pt called to report worsening productive cough with yellow mucous, wheezing and congestion since before Christmas. Pt reports having allergies and asthma/late COVID after COVID in 2021. Pt has been using albuterol  inhaler for SOB r/t coughing spells, mucinex  liquid cold and flu and dayquil. Pt denies any SOB w/o coughing, no fever. Appointment scheduled for evaluation. Patient agrees with plan of care, and will call back if anything changes, or if symptoms worsen.     1. ONSET: When did the cough begin?      Before christmas; pt states she has allergies and has asthma since having COVID in 2021 so was trying to tx at home but symptoms not improving   2. SEVERITY: How bad is the cough today?      Moderate  3. SPUTUM: Describe the color of your sputum (Murray.g., none, dry cough; clear, white, yellow, green)     Yellow  4. HEMOPTYSIS: Are you coughing up any blood? If Yes, ask: How much? (Murray.g., flecks, streaks, tablespoons, etc.)     No   5. DIFFICULTY BREATHING: Are you having difficulty breathing? If Yes,  ask: How bad is it? (Murray.g., mild, moderate, severe)      Only with coughing spells; pt uses albuterol  inhaler and symptoms subside   6. FEVER: Do you have a fever? If Yes, ask: What is your temperature, how was it measured, and when did it start?     No   8. LUNG HISTORY: Do you have any history of lung disease?  (Murray.g., pulmonary embolus, asthma, emphysema)     Long covid/asthma symptoms   10. OTHER SYMPTOMS: Do you have any other symptoms? (Murray.g., runny nose, wheezing, chest pain)       Congestion, wheezing  Protocols used: Cough - Acute Productive-A-AH

## 2024-07-18 NOTE — Progress Notes (Signed)
 "  Acute Office Visit  Patient ID: Kathryn Murray, female    DOB: 1960-09-08, 64 y.o.   MRN: [id number]  PCP: Duanne Butler DASEN, MD  Chief Complaint  Patient presents with   Acute Visit    Worsening cough: coughing, wheezing, runny nose, congestion. Since mid DEC      Subjective:     HPI  Discussed the use of AI scribe software for clinical note transcription with the patient, who gave verbal consent to proceed.  History of Present Illness Kathryn Murray is a 64 year old female who presents with persistent respiratory symptoms including cough, wheezing, and nasal congestion.  She has been experiencing coughing, wheezing, runny nose, and congestion. Her nasal discharge has occasional yellow mucus. Her nose runs uncontrollably at times, with one side blocked and the other side running. Symptoms are worse in the morning and evening. She used her inhaler and took DayQuil at 1 PM, which temporarily cleared her nasal congestion.  She has a history of using Singulair  and Flonase  for allergies, which were initially effective. However, she developed 'pus pockets' in her nose and stopped using these medications. She also stopped using Symbicort  due to a rash in her mouth and discontinued Singulair  due to mood changes. Currently, she is not taking any allergy medications and is managing symptoms with DayQuil and Mucinex  cold and flu. She has been using albuterol  daily, at least twice a day, and sometimes three times a day. She occasionally uses saline nasal spray for dryness.  Her symptoms began before Christmas and have worsened over the past month. She has not been around anyone sick recently, although she cares for her three grandchildren who were sick after Christmas.  She reports pain in her teeth on the left side, which she associates with her sinus issues. She occasionally experiences headaches. No fevers, chills, or body aches. She denies any history of asthma or COPD but notes that her  wheezing began after contracting COVID-19 in January 2021.   Review of Systems  All other systems reviewed and are negative.   Past Medical History:  Diagnosis Date   ADD (attention deficit disorder)    GERD (gastroesophageal reflux disease)    HLD (hyperlipidemia)    Wears glasses    reading    Past Surgical History:  Procedure Laterality Date   BREAST EXCISIONAL BIOPSY Right    BREAST SURGERY     right br bx x2   LIGAMENT REPAIR Right 01/03/2014   Procedure: RIGHT LONG REPAIR VS RECONSTRUCTION OF RADIAL COLLATERAL LIGAMENT ;  Surgeon: Donnice DELENA Robinsons, MD;  Location: [facility];  Service: Orthopedics;  Laterality: Right;   TOTAL ABDOMINAL HYSTERECTOMY  2005    Outpatient Medications Prior to Visit  Medication Sig Dispense Refill   albuterol  (VENTOLIN  HFA) 108 (90 Base) MCG/ACT inhaler TAKE 2 PUFFS BY MOUTH EVERY 6 HOURS AS NEEDED FOR WHEEZE OR SHORTNESS OF BREATH 6.7 each 1   amphetamine -dextroamphetamine  (ADDERALL) 20 MG tablet Take 1 tablet (20 mg total) by mouth 2 (two) times daily. 60 tablet 0   famotidine  (PEPCID ) 40 MG tablet TAKE 1 TABLET BY MOUTH EVERY DAY 90 tablet 0   pantoprazole  (PROTONIX ) 40 MG tablet TAKE 1 TABLET BY MOUTH EVERY DAY 90 tablet 1   zolpidem  (AMBIEN  CR) 6.25 MG CR tablet Take 1 tablet (6.25 mg total) by mouth at bedtime as needed. 30 tablet 3   benzonatate  (TESSALON ) 100 MG capsule Take 1 capsule (100 mg total) by mouth  3 (three) times daily as needed for cough. (Patient not taking: Reported on 07/18/2024) 30 capsule 0   Cholecalciferol (VITAMIN D3) 2000 units TABS Take 1 tablet by mouth daily. (Patient not taking: Reported on 07/18/2024)     ergocalciferol  (VITAMIN D2) 1.25 MG (50000 UT) capsule ergocalciferol  (vitamin D2) 1,250 mcg (50,000 unit) capsule  TAKE 1 CAPSULE EVERY WEEK BY ORAL ROUTE. (Patient not taking: Reported on 07/18/2024)     fluticasone  (FLONASE ) 50 MCG/ACT nasal spray SPRAY 2 SPRAYS INTO EACH NOSTRIL EVERY DAY  (Patient not taking: Reported on 07/18/2024) 48 mL 0   montelukast  (SINGULAIR ) 10 MG tablet TAKE 1 TABLET BY MOUTH EVERYDAY AT BEDTIME (Patient not taking: Reported on 07/18/2024) 90 tablet 1   promethazine -dextromethorphan (PROMETHAZINE -DM) 6.25-15 MG/5ML syrup Take 5 mLs by mouth 4 (four) times daily as needed for cough. (Patient not taking: Reported on 07/18/2024) 118 mL 0   SYMBICORT  160-4.5 MCG/ACT inhaler INHALE 2 PUFFS INTO THE LUNGS TWICE A DAY (Patient not taking: Reported on 07/18/2024) 10.2 each 3   No facility-administered medications prior to visit.    Allergies[1]     Objective:    BP (!) 147/89 Comment: right arm office cuff  Pulse 93   Temp 98.1 F (36.7 C)   Ht 5' 9 (1.753 m)   Wt 165 lb 9.6 oz (75.1 kg)   SpO2 99%   BMI 24.45 kg/m  BP Readings from Last 3 Encounters:  07/18/24 (!) 147/89  02/18/24 136/76  07/23/22 (!) 140/82   Wt Readings from Last 3 Encounters:  07/18/24 165 lb 9.6 oz (75.1 kg)  02/18/24 167 lb 9.6 oz (76 kg)  07/23/22 167 lb 9.6 oz (76 kg)      Physical Exam Vitals and nursing note reviewed.  Constitutional:      Appearance: Normal appearance. She is normal weight.  HENT:     Head: Normocephalic and atraumatic.     Right Ear: Tympanic membrane, ear canal and external ear normal.     Left Ear: Tympanic membrane, ear canal and external ear normal.     Nose: Congestion present.     Left Sinus: Maxillary sinus tenderness present.     Mouth/Throat:     Mouth: Mucous membranes are moist.     Pharynx: Oropharynx is clear.  Eyes:     Extraocular Movements: Extraocular movements intact.     Conjunctiva/sclera: Conjunctivae normal.     Pupils: Pupils are equal, round, and reactive to light.  Cardiovascular:     Rate and Rhythm: Normal rate and regular rhythm.     Pulses: Normal pulses.     Heart sounds: Normal heart sounds.  Pulmonary:     Effort: Pulmonary effort is normal.     Breath sounds: Decreased breath sounds and wheezing  present.  Musculoskeletal:     Cervical back: No tenderness.  Lymphadenopathy:     Cervical: No cervical adenopathy.  Skin:    General: Skin is warm and dry.  Neurological:     General: No focal deficit present.     Mental Status: She is alert and oriented to person, place, and time. Mental status is at baseline.  Psychiatric:        Mood and Affect: Mood normal.        Behavior: Behavior normal.        Thought Content: Thought content normal.        Judgment: Judgment normal.       No results found for any visits on  07/18/24.     Assessment & Plan:   Problem List Items Addressed This Visit       Respiratory   Acute non-recurrent maxillary sinusitis - Primary   Relevant Medications   predniSONE  (DELTASONE ) 20 MG tablet   amoxicillin -clavulanate (AUGMENTIN ) 875-125 MG tablet   Other Visit Diagnoses       Shortness of breath         Environmental and seasonal allergies       Relevant Orders   Ambulatory referral to Allergy       Assessment and Plan Assessment & Plan Acute maxillary sinusitis Suspected due to persistent nasal congestion, discharge, and facial pain, indicating possible sinus infection. - Prescribed Augmentin . - Recommend nasal saline flushes or neti pot.  Allergic rhinitis Chronic condition with nasal congestion and discharge. Previous treatments effective but discontinued due to side effects. Current management provides temporary relief. - Recommended starting Zyrtec. - Placed referral to allergy specialist in [place].  Wheezing and shortness of breath No asthma or COPD history. Possible post-COVID-19 respiratory impact. Prednisone  considered for lung constriction. - Prescribed prednisone  taper. - Continue regular use of albuterol  inhaler every 4 hours.    Meds ordered this encounter  Medications   predniSONE  (DELTASONE ) 20 MG tablet    Sig: 3 tabs poqday 1-2, 2 tabs poqday 3-4, 1 tab poqday 5-6    Dispense:  12 tablet    Refill:  0     Supervising Provider:   PICKARD, WARREN T [[id number]]   amoxicillin -clavulanate (AUGMENTIN ) 875-125 MG tablet    Sig: Take 1 tablet by mouth 2 (two) times daily.    Dispense:  20 tablet    Refill:  0    Supervising Provider:   DUANNE LOWERS T [[id number]]    No follow-ups on file.  Jeoffrey GORMAN Barrio, FNP [place] [facility]      [1]  Allergies Allergen Reactions   Sulfa Antibiotics Dermatitis   Sulphadimidine [Sulfamethazine] Rash   "

## 2024-07-19 ENCOUNTER — Other Ambulatory Visit: Payer: Self-pay | Admitting: Family Medicine

## 2024-07-19 NOTE — Telephone Encounter (Signed)
 Prescription Request  07/19/2024  LOV: 02/18/2024  What is the name of the medication or equipment?   pantoprazole  (PROTONIX ) 40 MG tablet  **90 day script requested**  Have you contacted your pharmacy to request a refill? Yes   Which pharmacy would you like this sent to?  [id number] [place], [place] - [address] [place] [address] Phone: [phone] Fax: [phone]    Patient notified that their request is being sent to the clinical staff for review and that they should receive a response within 2 business days.   Please advise pharmacist.

## 2024-07-20 MED ORDER — PANTOPRAZOLE SODIUM 40 MG PO TBEC: 40.0000 mg | DELAYED_RELEASE_TABLET | Freq: Every day | ORAL | 0 refills | Status: AC

## 2024-07-20 NOTE — Telephone Encounter (Signed)
 Requested Prescriptions  Pending Prescriptions Disp Refills   pantoprazole  (PROTONIX ) 40 MG tablet 90 tablet 0    Sig: Take 1 tablet (40 mg total) by mouth daily.     Gastroenterology: Proton Pump Inhibitors Passed - 07/20/2024  3:54 PM      Passed - Valid encounter within last 12 months    Recent Outpatient Visits           2 days ago Acute non-recurrent maxillary sinusitis   [place] [facility] Kayla Jeoffrey RAMAN, FNP   5 months ago Pure hypercholesterolemia   [place] [facility] Pickard, Butler DASEN, MD   1 year ago Colon cancer screening   [place] [facility] Pickard, Butler DASEN, MD

## 2024-08-04 ENCOUNTER — Other Ambulatory Visit: Payer: Self-pay | Admitting: Family Medicine

## 2024-08-04 ENCOUNTER — Encounter: Payer: Self-pay | Admitting: Family Medicine

## 2024-08-04 ENCOUNTER — Ambulatory Visit: Admitting: Family Medicine

## 2024-08-04 ENCOUNTER — Ambulatory Visit: Payer: Self-pay

## 2024-08-04 VITALS — BP 130/100 | HR 98 | Temp 98.3°F | Ht 69.0 in | Wt 166.5 lb

## 2024-08-04 DIAGNOSIS — R0982 Postnasal drip: Secondary | ICD-10-CM

## 2024-08-04 DIAGNOSIS — J302 Other seasonal allergic rhinitis: Secondary | ICD-10-CM

## 2024-08-04 DIAGNOSIS — J4541 Moderate persistent asthma with (acute) exacerbation: Secondary | ICD-10-CM

## 2024-08-04 MED ORDER — BREZTRI AEROSPHERE 160-9-4.8 MCG/ACT IN AERO: 2.0000 | INHALATION_SPRAY | Freq: Two times a day (BID) | RESPIRATORY_TRACT | 11 refills | Status: AC

## 2024-08-04 MED ORDER — ALBUTEROL SULFATE HFA 108 (90 BASE) MCG/ACT IN AERS: 2.0000 | INHALATION_SPRAY | Freq: Four times a day (QID) | RESPIRATORY_TRACT | 1 refills | Status: AC | PRN

## 2024-08-04 MED ORDER — PREDNISONE 10 MG (21) PO TBPK: ORAL_TABLET | ORAL | 0 refills | Status: AC

## 2024-08-04 MED ORDER — IPRATROPIUM BROMIDE 0.03 % NA SOLN: 2.0000 | Freq: Two times a day (BID) | NASAL | 2 refills | Status: AC

## 2024-08-04 NOTE — Telephone Encounter (Signed)
 FYI Only or Action Required?: FYI only for provider: appointment scheduled on 1/30.  Patient was last seen in primary care on 07/18/2024 by Kayla Jeoffrey RAMAN, FNP.  Called Nurse Triage reporting Asthma.  Symptoms began several days ago.  Interventions attempted: Prescription medications: albuterol .  Symptoms are: gradually worsening.  Triage Disposition: No disposition on file.  Patient/caregiver understands and will follow disposition?: Yes  Reason for Triage: Worsening symptoms have gotten worse. Mainly coughing and wheezing, sinusitis. Have been speaking with PCP regarding this on myChart. Transferring to NT   Answer Assessment - Initial Assessment Questions 1. RESPIRATORY STATUS: Describe your breathing? (e.g., wheezing, shortness of breath, unable to speak, severe coughing)      Cough is continuous, is able to speak in full sentences. Does express some SOB  6. ASTHMA MEDICINES:  What treatments have you tried?      Taking albuterol  q4hr 7. INHALED QUICK-RELIEF TREATMENTS FOR THIS ATTACK: What treatments have you given yourself so far? and How many and how often? If using an inhaler, ask, How many puffs? Note: Routine treatments are 2 puffs every 4 hours as needed. Rescue treatments are 4 puffs repeated every 20 minutes, up to three times as needed.      Albuterol  2 puffs q4h  Protocols used: Asthma Attack-A-AH

## 2024-08-04 NOTE — Progress Notes (Unsigned)
 "  Patient Office Visit  Assessment & Plan:  Moderate persistent asthma with acute exacerbation -     Eosinophil count -     Ambulatory referral to Allergy -     predniSONE ; Use as directed.  Dispense: 21 each; Refill: 0 -     Albuterol  Sulfate HFA; Inhale 2 puffs into the lungs every 6 (six) hours as needed for wheezing or shortness of breath.  Dispense: 6.7 each; Refill: 1 -     IgE  Seasonal allergies -     predniSONE ; Use as directed.  Dispense: 21 each; Refill: 0 -     Ipratropium Bromide ; Place 2 sprays into both nostrils every 12 (twelve) hours.  Dispense: 30 mL; Refill: 2  Postnasal drip -     Ipratropium Bromide ; Place 2 sprays into both nostrils every 12 (twelve) hours.  Dispense: 30 mL; Refill: 2   Assessment and Plan    Moderate persistent asthma with acute exacerbation Moderate persistent asthma exacerbated by environmental factors. Oxygen saturation decreased to 95%. Differential includes eosinophilic asthma due to elevated eosinophils. Previous treatment with Symbicort  and prednisone  provided partial relief. - Ordered eosinophil count. - Referred to allergist in [place]. - Prescribed Atrovent  nasal spray. - Refilled albuterol  inhaler. - Prescribed additional prednisone .  Seasonal allergic rhinitis Symptoms include runny nose, sneezing, and itchy eyes, exacerbated by environmental triggers. Recent switch to Zyrtec with uncertain efficacy. - Continue Zyrtec. - Referred to allergist.  Gastroesophageal reflux disease GERD contributing to cough, particularly postprandial.      We will have to have patient come back to do IgE levels.  Return if symptoms worsen or fail to improve.  Patient prefers to see allergist closer to her home in [place] Subjective:    Patient ID: Kathryn Murray, female    DOB: 01/31/1961  Age: 64 y.o. MRN: [id number]  Chief Complaint  Patient presents with   Cough    Cough and wheezing that has gotten worse since December.      Cough   Discussed the use of AI scribe software for clinical note transcription with the patient, who gave verbal consent to proceed.  History of Present Illness     History of Present Illness Kathryn Murray is a 64 year old female who presents with persistent respiratory symptoms following a history of COVID-19 and bronchitis.  She has been experiencing persistent respiratory symptoms since contracting COVID-19 in January 2021. Initially, she developed bronchitis and wheezing, which were new symptoms for her as she had no prior history of asthma or lung issues. She completed a 10-day course of antibiotics prescribed on July 18, 2024, but her symptoms have persisted.  She has been using Symbicort  but is no longer on it. Recently, she was prescribed a stronger inhaler. She experiences asthma attacks, particularly when on the phone, and uses her inhaler every four hours. Her symptoms worsen about three to three and a half hours after using the inhaler, with increased wheezing and coughing, especially in the morning and at night.  She has a history of environmental exposures, including three cats she has had for seven years and recent mold discovery in her home. She is considering allergy testing to identify potential triggers. She denies smoking and has no history of asthma as a child.  She experiences nasal symptoms, including runny nose, postnasal drip, and nasal congestion, which are worse at night and in the morning. She has been taking Zyrtec for these symptoms but is unsure of its effectiveness. She  previously used Singulair  and Flonase  but discontinued them due to side effects, including mood changes and nasal issues.  She takes Adderall twice a day, which she notes may be contributing to elevated blood pressure. She does not take medication specifically for blood pressure and is unsure if she can detect when it is elevated.  She has a history of reflux and takes medication for it,  noting that eating can exacerbate her cough. 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  No fever, chills, or flu-like symptoms.  Physical Exam VITALS: SaO2- 95% HEENT: Nasal mucosa slightly swollen. CHEST: Lungs clear to auscultation bilaterally.  Assessment and Plan Moderate persistent asthma with acute exacerbation Moderate persistent asthma exacerbated by environmental factors. Oxygen saturation decreased to 95%. Differential includes eosinophilic asthma due to possible elevated eosinophils. Previous treatment with Symbicort  and prednisone  provided partial relief. - Ordered eosinophil count (and IgE) - Referred to allergist in [place]. - Prescribed Atrovent  nasal spray. - Refilled albuterol  inhaler. - Prescribed additional prednisone .  Seasonal allergic rhinitis Symptoms include runny nose, sneezing, and itchy eyes, exacerbated by environmental triggers. Recent switch to Zyrtec with uncertain efficacy. - Continue Zyrtec. - Referred to allergist.  Gastroesophageal reflux disease GERD contributing to cough, particularly postprandial.    The 10-year ASCVD risk score (Arnett DK, et al., 2019) is: 4.7%  Past Medical History:  Diagnosis Date   ADD (attention deficit disorder)    GERD (gastroesophageal reflux disease)    HLD (hyperlipidemia)    Wears glasses    reading   Past Surgical History:  Procedure Laterality Date   BREAST EXCISIONAL BIOPSY Right    BREAST SURGERY     right br bx x2   LIGAMENT REPAIR Right 01/03/2014   Procedure: RIGHT LONG REPAIR VS RECONSTRUCTION OF RADIAL COLLATERAL LIGAMENT ;  Surgeon: Donnice DELENA Robinsons, MD;  [facility];  Service: Orthopedics;  Laterality: Right;   TOTAL ABDOMINAL HYSTERECTOMY  2005   Social History[1] Family History  Problem Relation Age of Onset   Heart disease  Other    Allergies[2]  Review of Systems  Respiratory:  Positive for cough.       Objective:    BP (!) 130/100   Pulse 98   Temp 98.3 F (36.8 C)   Ht 5' 9 (1.753 m)   Wt 166 lb 8 oz (75.5 kg)   SpO2 95%   BMI 24.59 kg/m  BP Readings from Last 3 Encounters:  08/04/24 (!) 130/100  07/18/24 (!) 147/89  02/18/24 136/76   Wt Readings from Last 3 Encounters:  08/04/24 166 lb 8 oz (75.5 kg)  07/18/24 165 lb 9.6 oz (75.1 kg)  02/18/24 167 lb 9.6 oz (76 kg)    Physical Exam Vitals and nursing note reviewed.  Constitutional:      General: She is not in acute distress.    Appearance: Normal appearance.     Comments: Patient does cough during office visit.   HENT:     Head: Normocephalic.     Right Ear: Tympanic membrane, ear canal and external ear normal.     Left Ear: Tympanic membrane, ear canal and external ear normal.     Nose: Congestion and rhinorrhea present.     Right Turbinates: Swollen.     Left Turbinates: Swollen.     Comments: Turbinates slightly swollen but no obstruction Eyes:  Extraocular Movements: Extraocular movements intact.     Pupils: Pupils are equal, round, and reactive to light.  Cardiovascular:     Rate and Rhythm: Normal rate and regular rhythm.     Heart sounds: Normal heart sounds.  Pulmonary:     Effort: Pulmonary effort is normal.     Breath sounds: Normal breath sounds. No wheezing or rhonchi.  Musculoskeletal:     Right lower leg: No edema.     Left lower leg: No edema.  Neurological:     General: No focal deficit present.     Mental Status: She is alert and oriented to person, place, and time.  Psychiatric:        Mood and Affect: Mood normal.        Behavior: Behavior normal.      Results for orders placed or performed in visit on 08/04/24  Eosinophil count  Result Value Ref Range   WBC 9.3 3.8 - 10.8 Thousand/uL   Eosinophils Absolute 1,218 (H) 15 - 500 cells/uL   Eosinophils Relative 13.1 %            [1]   Social History Tobacco Use   Smoking status: Never   Smokeless tobacco: Never  Substance Use Topics   Alcohol use: Yes    Alcohol/week: 0.0 standard drinks of alcohol    Comment: Wine twice a week   Drug use: No  [2]  Allergies Allergen Reactions   Sulfa Antibiotics Dermatitis   Sulphadimidine [Sulfamethazine] Rash   "

## 2024-08-05 LAB — EOSINOPHIL COUNT
Eosinophils Absolute: 1218 {cells}/uL — ABNORMAL HIGH (ref 15–500)
Eosinophils Relative: 13.1 %
WBC: 9.3 10*3/uL (ref 3.8–10.8)

## 2024-08-07 ENCOUNTER — Encounter: Payer: Self-pay | Admitting: Family Medicine

## 2024-08-07 ENCOUNTER — Ambulatory Visit: Payer: Self-pay | Admitting: Family Medicine

## 2024-08-08 ENCOUNTER — Other Ambulatory Visit

## 2024-08-08 DIAGNOSIS — J4541 Moderate persistent asthma with (acute) exacerbation: Secondary | ICD-10-CM

## 2024-08-09 ENCOUNTER — Ambulatory Visit: Payer: Self-pay | Admitting: Family Medicine

## 2024-08-09 LAB — IGE: IgE (Immunoglobulin E), Serum: 163 kU/L — ABNORMAL HIGH

## 2024-08-22 ENCOUNTER — Ambulatory Visit: Admitting: Allergy

## 2024-09-05 ENCOUNTER — Ambulatory Visit: Admitting: Allergy & Immunology
